# Patient Record
Sex: Male | Born: 1960 | Race: White | Hispanic: No | Marital: Married | Smoking: Never smoker
Health system: Southern US, Community
[De-identification: ages and names within clinical notes are randomized; demographics above are authoritative.]

## PROBLEM LIST (undated history)

## (undated) DIAGNOSIS — C801 Malignant (primary) neoplasm, unspecified: Secondary | ICD-10-CM

## (undated) DIAGNOSIS — I2510 Atherosclerotic heart disease of native coronary artery without angina pectoris: Secondary | ICD-10-CM

## (undated) HISTORY — DX: Malignant (primary) neoplasm, unspecified: C80.1

## (undated) HISTORY — PX: APPENDECTOMY: SHX54

## (undated) HISTORY — PX: THROAT SURGERY: SHX803

## (undated) HISTORY — PX: COLONOSCOPY: SHX174

---
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
## 2005-09-26 ENCOUNTER — Ambulatory Visit: Payer: Self-pay | Admitting: Otolaryngology

## 2007-09-12 ENCOUNTER — Other Ambulatory Visit: Payer: Self-pay

## 2007-09-13 ENCOUNTER — Inpatient Hospital Stay: Payer: Self-pay | Admitting: Vascular Surgery

## 2015-02-24 ENCOUNTER — Encounter: Payer: Self-pay | Admitting: Family Medicine

## 2015-02-24 ENCOUNTER — Ambulatory Visit (INDEPENDENT_AMBULATORY_CARE_PROVIDER_SITE_OTHER): Payer: BLUE CROSS/BLUE SHIELD | Admitting: Family Medicine

## 2015-02-24 VITALS — BP 130/70 | HR 73 | Temp 98.2°F | Resp 16 | Ht 72.0 in | Wt 230.7 lb

## 2015-02-24 DIAGNOSIS — Z8489 Family history of other specified conditions: Secondary | ICD-10-CM

## 2015-02-24 DIAGNOSIS — Z13220 Encounter for screening for lipoid disorders: Secondary | ICD-10-CM

## 2015-02-24 DIAGNOSIS — Z136 Encounter for screening for cardiovascular disorders: Secondary | ICD-10-CM

## 2015-02-24 DIAGNOSIS — Z1211 Encounter for screening for malignant neoplasm of colon: Secondary | ICD-10-CM

## 2015-02-24 DIAGNOSIS — IMO0001 Reserved for inherently not codable concepts without codable children: Secondary | ICD-10-CM | POA: Insufficient documentation

## 2015-02-24 DIAGNOSIS — E66811 Obesity, class 1: Secondary | ICD-10-CM

## 2015-02-24 DIAGNOSIS — Z8249 Family history of ischemic heart disease and other diseases of the circulatory system: Secondary | ICD-10-CM | POA: Insufficient documentation

## 2015-02-24 DIAGNOSIS — Z7189 Other specified counseling: Secondary | ICD-10-CM | POA: Insufficient documentation

## 2015-02-24 DIAGNOSIS — E669 Obesity, unspecified: Secondary | ICD-10-CM

## 2015-02-24 NOTE — Progress Notes (Signed)
Name: Dustin Hall   MRN: [id number]    DOB: November 16, 1960   Date:02/24/2015       Progress Note  Subjective  Chief Complaint  Chief Complaint  Patient presents with  . New Patient (Initial Visit)    HPI  Dustin Hall is a 54 y.o. male here today to transition care of medical needs to a primary care provider. He has not seen a regular doctor in many years. Reports no concerns today. No symptoms. His wife insisted that he gets a doctor and does a colonoscopy. He is hesitant to get blood work, Medical illustrator. Otherwise he eventually did say he had concerns that his father and some other relatives on his father's side died of aneurysms. He himself has no chest pain, SOB, edema, palpitations.    History reviewed. No pertinent past medical history.  Patient Active Problem List   Diagnosis Date Noted  . Family history of aneurysm 02/24/2015  . Obesity, Class I, BMI 30.0-34.9 (see actual BMI) 02/24/2015  . Encounter for screening for malignant neoplasm of colon 02/24/2015  . Encounter for cholesteral screening for cardiovascular disease 02/24/2015    Social History  Substance Use Topics  . Smoking status: Never Smoker   . Smokeless tobacco: Current User    Types: Chew  . Alcohol Use: 0.0 oz/week    0 Standard drinks or equivalent per week     Comment: occasional     Current outpatient prescriptions:  .  meloxicam (MOBIC) 15 MG tablet, , Disp: , Rfl:   Past Surgical History  Procedure Laterality Date  . Appendectomy    . Throat surgery Right     Family History  Problem Relation Age of Onset  . Aortic aneurysm Father     No Known Allergies   Review of Systems  CONSTITUTIONAL: No significant weight changes, fever, chills, weakness or fatigue.  HEENT:  - Eyes: No visual changes.  - Ears: No auditory changes. No pain.  - Nose: No sneezing, congestion, runny nose. - Throat: No sore throat. No changes in swallowing. SKIN: No rash or itching.  CARDIOVASCULAR: No chest  pain, chest pressure or chest discomfort. No palpitations or edema.  RESPIRATORY: No shortness of breath, cough or sputum.  GASTROINTESTINAL: No anorexia, nausea, vomiting. No changes in bowel habits. No abdominal pain or blood.  GENITOURINARY: No dysuria. No frequency. No discharge.  NEUROLOGICAL: No headache, dizziness, syncope, paralysis, ataxia, numbness or tingling in the extremities. No memory changes. No change in bowel or bladder control.  MUSCULOSKELETAL: No joint pain. No muscle pain. HEMATOLOGIC: No anemia, bleeding or bruising.  LYMPHATICS: No enlarged lymph nodes.  PSYCHIATRIC: No change in mood. No change in sleep pattern.  ENDOCRINOLOGIC: No reports of sweating, cold or heat intolerance. No polyuria or polydipsia.     Objective  BP 130/70 mmHg  Pulse 73  Temp(Src) 98.2 F (36.8 C) (Oral)  Resp 16  Ht 6' (1.829 m)  Wt 230 lb 11.2 oz (104.645 kg)  BMI 31.28 kg/m2  SpO2 97% Body mass index is 31.28 kg/(m^2).  Physical Exam  Constitutional: Patient appears well-developed and well-nourished. In no distress.  HEENT:  - Head: Normocephalic and atraumatic.  - Ears: Bilateral TMs gray, no erythema or effusion - Nose: Nasal mucosa moist - Mouth/Throat: Oropharynx is clear and moist. No tonsillar hypertrophy or erythema. No post nasal drainage.  - Eyes: Conjunctivae clear, EOM movements normal. PERRLA. No scleral icterus.  Neck: Normal range of motion. Neck supple. No JVD present.  No thyromegaly present.  Cardiovascular: Normal rate, regular rhythm and normal heart sounds.  No murmur heard.  Pulmonary/Chest: Effort normal and breath sounds normal. No respiratory distress. Musculoskeletal: Normal range of motion bilateral UE and LE, no joint effusions. Peripheral vascular: Bilateral LE no edema. Neurological: CN II-XII grossly intact with no focal deficits. Alert and oriented to person, place, and time. Coordination, balance, strength, speech and gait are normal.  Skin:  Skin is warm and dry. No rash noted. No erythema.  Psychiatric: Patient has a normal mood and affect. Behavior is normal in office today. Judgment and thought content normal in office today.   Assessment & Plan  1. Obesity, Class I, BMI 30.0-34.9 (see actual BMI) The patient has been counseled on their higher than normal BMI.  They have verbally expressed understanding their increased risk for other diseases.  In efforts to meet a better target BMI goal the patient has been counseled on lifestyle, diet and exercise modification tactics. Start with moderate intensity aerobic exercise (walking, jogging, elliptical, swimming, group or individual sports, hiking) at least 32mins a day at least 4 days a week and increase intensity, duration, frequency as tolerated. Diet should include well balance fresh fruits and vegetables avoiding processed foods, carbohydrates and sugars. Drink at least 8oz 10 glasses a day avoiding sodas, sugary fruit drinks, sweetened tea. Check weight on a reliable scale daily and monitor weight loss progress daily. Consider investing in mobile phone apps that will help keep track of weight loss goals.   2. Family history of aneurysm I highly recommend that he considers getting an echocardiogram and stress test esp if his cholesterol is high.  - CBC with Differential/Platelet - Comprehensive metabolic panel - Lipid panel  3. Encounter for screening for malignant neoplasm of colon Referred to [facility] at [facility] per his request.  - Ambulatory referral to Gastroenterology  4. Encounter for cholesteral screening for cardiovascular disease  - Lipid panel

## 2015-03-24 ENCOUNTER — Telehealth: Payer: Self-pay

## 2015-03-24 DIAGNOSIS — K219 Gastro-esophageal reflux disease without esophagitis: Secondary | ICD-10-CM | POA: Insufficient documentation

## 2015-03-24 HISTORY — DX: Gastro-esophageal reflux disease without esophagitis: K21.9

## 2015-03-24 NOTE — Telephone Encounter (Signed)
Additional order placed

## 2015-03-24 NOTE — Telephone Encounter (Signed)
This patient needs a referral for an endoscopy at the same location that he is having his colonoscopy. He is scheduled to have the colonoscopy on 04/30/15.

## 2018-07-12 ENCOUNTER — Other Ambulatory Visit: Payer: Self-pay | Admitting: Unknown Physician Specialty

## 2018-07-12 DIAGNOSIS — R221 Localized swelling, mass and lump, neck: Secondary | ICD-10-CM

## 2018-07-17 ENCOUNTER — Other Ambulatory Visit: Payer: Self-pay

## 2018-07-17 ENCOUNTER — Ambulatory Visit
Admission: RE | Admit: 2018-07-17 | Discharge: 2018-07-17 | Disposition: A | Discharge status: Home / Self Care | Payer: BLUE CROSS/BLUE SHIELD | Source: Ambulatory Visit | Attending: Unknown Physician Specialty | Admitting: Unknown Physician Specialty

## 2018-07-17 DIAGNOSIS — R221 Localized swelling, mass and lump, neck: Secondary | ICD-10-CM | POA: Diagnosis not present

## 2018-07-17 MED ORDER — IOHEXOL 300 MG/ML  SOLN
75.0000 mL | Freq: Once | INTRAMUSCULAR | Status: AC | PRN
  Administered 2018-07-17: 75 mL via INTRAVENOUS

## 2018-07-18 ENCOUNTER — Other Ambulatory Visit: Payer: Self-pay | Admitting: Unknown Physician Specialty

## 2018-07-18 DIAGNOSIS — R221 Localized swelling, mass and lump, neck: Secondary | ICD-10-CM

## 2018-07-22 ENCOUNTER — Other Ambulatory Visit: Payer: Self-pay | Admitting: Radiology

## 2018-07-23 ENCOUNTER — Other Ambulatory Visit: Payer: Self-pay

## 2018-07-23 ENCOUNTER — Ambulatory Visit
Admission: RE | Admit: 2018-07-23 | Discharge: 2018-07-23 | Disposition: A | Discharge status: Home / Self Care | Payer: BLUE CROSS/BLUE SHIELD | Source: Ambulatory Visit | Attending: Unknown Physician Specialty | Admitting: Unknown Physician Specialty

## 2018-07-23 DIAGNOSIS — R599 Enlarged lymph nodes, unspecified: Secondary | ICD-10-CM | POA: Insufficient documentation

## 2018-07-23 DIAGNOSIS — R221 Localized swelling, mass and lump, neck: Secondary | ICD-10-CM

## 2018-07-23 LAB — PROTIME-INR
INR: 1.1 (ref 0.8–1.2)
Prothrombin Time: 13.7 seconds (ref 11.4–15.2)

## 2018-07-23 LAB — CBC
HCT: 44.3 % (ref 39.0–52.0)
Hemoglobin: 14.7 g/dL (ref 13.0–17.0)
MCH: 29.4 pg (ref 26.0–34.0)
MCHC: 33.2 g/dL (ref 30.0–36.0)
MCV: 88.6 fL (ref 80.0–100.0)
Platelets: 200 10*3/uL (ref 150–400)
RBC: 5 MIL/uL (ref 4.22–5.81)
RDW: 12.9 % (ref 11.5–15.5)
WBC: 7 10*3/uL (ref 4.0–10.5)
nRBC: 0 % (ref 0.0–0.2)

## 2018-07-23 MED ORDER — MIDAZOLAM HCL 2 MG/2ML IJ SOLN
INTRAMUSCULAR | Status: AC | PRN
  Administered 2018-07-23: 1 mg via INTRAVENOUS

## 2018-07-23 MED ORDER — MIDAZOLAM HCL 2 MG/2ML IJ SOLN
INTRAMUSCULAR | Status: AC
  Filled 2018-07-23: qty 2

## 2018-07-23 MED ORDER — SODIUM CHLORIDE 0.9 % IV SOLN
INTRAVENOUS | Status: DC
  Administered 2018-07-23: 11:00:00 via INTRAVENOUS

## 2018-07-23 MED ORDER — FENTANYL CITRATE (PF) 100 MCG/2ML IJ SOLN
INTRAMUSCULAR | Status: AC
  Filled 2018-07-23: qty 2

## 2018-07-23 NOTE — Procedures (Signed)
US lymph node bx without difficulty  Complications:  None  Blood Loss: none  See dictation in canopy pacs

## 2018-07-26 ENCOUNTER — Other Ambulatory Visit: Payer: Self-pay | Admitting: Anatomic Pathology & Clinical Pathology

## 2018-07-26 LAB — SURGICAL PATHOLOGY

## 2018-07-29 ENCOUNTER — Other Ambulatory Visit (HOSPITAL_COMMUNITY): Payer: Self-pay | Admitting: Unknown Physician Specialty

## 2018-07-29 ENCOUNTER — Other Ambulatory Visit: Payer: Self-pay | Admitting: Unknown Physician Specialty

## 2018-07-29 DIAGNOSIS — C760 Malignant neoplasm of head, face and neck: Secondary | ICD-10-CM

## 2018-07-31 ENCOUNTER — Encounter: Payer: Self-pay | Admitting: Licensed Clinical Social Worker

## 2018-08-01 ENCOUNTER — Other Ambulatory Visit: Payer: Self-pay

## 2018-08-01 ENCOUNTER — Other Ambulatory Visit: Payer: BLUE CROSS/BLUE SHIELD

## 2018-08-01 NOTE — Progress Notes (Signed)
Tumor Board Documentation  HUSAIN COSTABILE was presented at our Tumor Board on 08/01/2018, which included representatives from medical oncology, radiation oncology, surgical oncology, surgical, radiology, navigation, internal medicine, genetics, research, palliative care.  Azzam currently presents for discussion, for [place], for new positive pathology with history of the following treatments: surgical intervention(s).  Additionally, we reviewed previous medical and familial history, history of present illness, and recent lab results along with all available histopathologic and imaging studies. The tumor board considered available treatment options and made the following recommendations: Concurrent chemo-radiation therapy PET for Staging  The following procedures/referrals were also placed: No orders of the defined types were placed in this encounter.   Clinical Trial Status: not discussed   Staging used: AJCC Stage Group  AJCC Staging: T: Tx N: N1   Group: Possible Stage 1,  may change after PET imaging   National site-specific guidelines NCCN were discussed with respect to the case.  Tumor board is a meeting of clinicians from various specialty areas who evaluate and discuss patients for whom a multidisciplinary approach is being considered. Final determinations in the plan of care are those of the provider(s). The responsibility for follow up of recommendations given during tumor board is that of the provider.   Today's extended care, comprehensive team conference, Manav was not present for the discussion and was not examined.   Multidisciplinary Tumor Board is a multidisciplinary case peer review process.  Decisions discussed in the Multidisciplinary Tumor Board reflect the opinions of the specialists present at the conference without having examined the patient.  Ultimately, treatment and diagnostic decisions rest with the primary provider(s) and the patient.

## 2018-08-02 ENCOUNTER — Other Ambulatory Visit: Payer: Self-pay

## 2018-08-02 ENCOUNTER — Encounter: Payer: Self-pay | Admitting: Oncology

## 2018-08-02 ENCOUNTER — Inpatient Hospital Stay: Payer: BLUE CROSS/BLUE SHIELD | Attending: Oncology | Admitting: Oncology

## 2018-08-02 VITALS — BP 143/92 | HR 66 | Temp 97.5°F | Ht 72.0 in | Wt 232.0 lb

## 2018-08-02 DIAGNOSIS — C801 Malignant (primary) neoplasm, unspecified: Secondary | ICD-10-CM | POA: Insufficient documentation

## 2018-08-02 DIAGNOSIS — C779 Secondary and unspecified malignant neoplasm of lymph node, unspecified: Secondary | ICD-10-CM | POA: Insufficient documentation

## 2018-08-02 DIAGNOSIS — C770 Secondary and unspecified malignant neoplasm of lymph nodes of head, face and neck: Secondary | ICD-10-CM | POA: Diagnosis not present

## 2018-08-02 HISTORY — DX: Malignant (primary) neoplasm, unspecified: C80.1

## 2018-08-02 HISTORY — DX: Secondary and unspecified malignant neoplasm of lymph node, unspecified: C77.9

## 2018-08-02 MED ORDER — ONDANSETRON HCL 8 MG PO TABS: 8.0000 mg | ORAL_TABLET | Freq: Two times a day (BID) | ORAL | 1 refills | Status: DC | PRN

## 2018-08-02 MED ORDER — PROCHLORPERAZINE MALEATE 10 MG PO TABS: 10.0000 mg | ORAL_TABLET | Freq: Four times a day (QID) | ORAL | 1 refills | Status: DC | PRN

## 2018-08-02 NOTE — Patient Instructions (Signed)
Cisplatin injection  What is this medicine?  CISPLATIN (SIS pla tin) is a chemotherapy drug. It targets fast dividing cells, like cancer cells, and causes these cells to die. This medicine is used to treat many types of cancer like bladder, ovarian, and testicular cancers.  This medicine may be used for other purposes; ask your health care provider or pharmacist if you have questions.  COMMON BRAND NAME(S): Platinol, Platinol -AQ  What should I tell my health care provider before I take this medicine?  They need to know if you have any of these conditions:  -blood disorders  -hearing problems  -kidney disease  -recent or ongoing radiation therapy  -an unusual or allergic reaction to cisplatin, carboplatin, other chemotherapy, other medicines, foods, dyes, or preservatives  -pregnant or trying to get pregnant  -breast-feeding  How should I use this medicine?  This drug is given as an infusion into a vein. It is administered in a hospital or clinic by a specially trained health care professional.  Talk to your pediatrician regarding the use of this medicine in children. Special care may be needed.  Overdosage: If you think you have taken too much of this medicine contact a poison control center or emergency room at once.  NOTE: This medicine is only for you. Do not share this medicine with others.  What if I miss a dose?  It is important not to miss a dose. Call your doctor or health care professional if you are unable to keep an appointment.  What may interact with this medicine?  -dofetilide  -foscarnet  -medicines for seizures  -medicines to increase blood counts like filgrastim, pegfilgrastim, sargramostim  -probenecid  -pyridoxine used with altretamine  -rituximab  -some antibiotics like amikacin, gentamicin, neomycin, polymyxin B, streptomycin, tobramycin  -sulfinpyrazone  -vaccines  -zalcitabine  Talk to your doctor or health care professional before taking any of these  medicines:  -acetaminophen  -aspirin  -ibuprofen  -ketoprofen  -naproxen  This list may not describe all possible interactions. Give your health care provider a list of all the medicines, herbs, non-prescription drugs, or dietary supplements you use. Also tell them if you smoke, drink alcohol, or use illegal drugs. Some items may interact with your medicine.  What should I watch for while using this medicine?  Your condition will be monitored carefully while you are receiving this medicine. You will need important blood work done while you are taking this medicine.  This drug may make you feel generally unwell. This is not uncommon, as chemotherapy can affect healthy cells as well as cancer cells. Report any side effects. Continue your course of treatment even though you feel ill unless your doctor tells you to stop.  In some cases, you may be given additional medicines to help with side effects. Follow all directions for their use.  Call your doctor or health care professional for advice if you get a fever, chills or sore throat, or other symptoms of a cold or flu. Do not treat yourself. This drug decreases your body's ability to fight infections. Try to avoid being around people who are sick.  This medicine may increase your risk to bruise or bleed. Call your doctor or health care professional if you notice any unusual bleeding.  Be careful brushing and flossing your teeth or using a toothpick because you may get an infection or bleed more easily. If you have any dental work done, tell your dentist you are receiving this medicine.  Avoid taking products   that contain aspirin, acetaminophen, ibuprofen, naproxen, or ketoprofen unless instructed by your doctor. These medicines may hide a fever.  Do not become pregnant while taking this medicine. Women should inform their doctor if they wish to become pregnant or think they might be pregnant. There is a potential for serious side effects to an unborn child. Talk to  your health care professional or pharmacist for more information. Do not breast-feed an infant while taking this medicine.  Drink fluids as directed while you are taking this medicine. This will help protect your kidneys.  Call your doctor or health care professional if you get diarrhea. Do not treat yourself.  What side effects may I notice from receiving this medicine?  Side effects that you should report to your doctor or health care professional as soon as possible:  -allergic reactions like skin rash, itching or hives, swelling of the face, lips, or tongue  -signs of infection - fever or chills, cough, sore throat, pain or difficulty passing urine  -signs of decreased platelets or bleeding - bruising, pinpoint red spots on the skin, black, tarry stools, nosebleeds  -signs of decreased red blood cells - unusually weak or tired, fainting spells, lightheadedness  -breathing problems  -changes in hearing  -gout pain  -low blood counts - This drug may decrease the number of white blood cells, red blood cells and platelets. You may be at increased risk for infections and bleeding.  -nausea and vomiting  -pain, swelling, redness or irritation at the injection site  -pain, tingling, numbness in the hands or feet  -problems with balance, movement  -trouble passing urine or change in the amount of urine  Side effects that usually do not require medical attention (report to your doctor or health care professional if they continue or are bothersome):  -changes in vision  -loss of appetite  -metallic taste in the mouth or changes in taste  This list may not describe all possible side effects. Call your doctor for medical advice about side effects. You may report side effects to FDA at 1-800-FDA-1088.  Where should I keep my medicine?  This drug is given in a hospital or clinic and will not be stored at home.  NOTE: This sheet is a summary. It may not cover all possible information. If you have questions about this medicine,  talk to your doctor, pharmacist, or health care provider.   2019 Elsevier/Gold Standard (2007-07-16 14:40:54)

## 2018-08-02 NOTE — Progress Notes (Signed)
Patient is here today to establish care for neck mass, left. Patient stated that he noticed the mass about two weeks. Patient denied fever, chills, nausea, vomiting, dysphagia or difficulty chewing.

## 2018-08-02 NOTE — Progress Notes (Signed)
  Oncology Nurse Navigator Documentation  Navigator Location: [facility]-Med Onc (08/02/18 1200) Referral date to RadOnc/MedOnc: 08/02/18 (08/02/18 1200) )Navigator Encounter Type: Introductory phone call (08/02/18 1200)     Confirmed Diagnosis Date: 07/23/18 (08/02/18 1200)               Patient Visit Type: MedOnc;Initial (08/02/18 1200) Treatment Phase: Pre-Tx/Tx Discussion (08/02/18 1200) Barriers/Navigation Needs: Family concerns;Education;Coordination of Care (08/02/18 1200)   Interventions: Coordination of Care;Education;Psycho-social support (08/02/18 1200)   Coordination of Care: Appts (08/02/18 1200)                  Time Spent with Patient: 120 (08/02/18 1200)   Spoke with patient and wife during prescreen for [place].  Anxious about no visitor policy.  Agreed to accompany patient to initial Med/onc visit with Dr. Grayland Ormond today.  Supported patient at visit.  Wife on phone during treatment discussion.  Short tour of [place] and infusion suite.  Dr. Grayland Ormond thoroughly explained planned treatment.

## 2018-08-02 NOTE — Progress Notes (Signed)
START ON PATHWAY REGIMEN - Head and Neck     A cycle is every 7 days:     Cisplatin   **Always confirm dose/schedule in your pharmacy ordering system**  Patient Characteristics: Other Sites, Stage III, IVA, IVB; Unresectable Disease Classification: Other Sites AJCC 8 Stage Grouping: IVA Current Disease Status: No Distant Metastases and No Recurrent Disease Intent of Therapy: Curative Intent, Discussed with Patient

## 2018-08-02 NOTE — Progress Notes (Signed)
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  Subsequent imaging and lymph node biopsy revealed squamous cell carcinoma.  Further evaluation by ENT did not reveal distinct primary source.  Patient has some mild neck tenderness, but otherwise feels well.  He has no neurologic complaints.  He denies any recent fevers or illnesses.  He has a good appetite and denies weight loss.  He denies any dysphasia.  He has no chest pain, shortness of breath, cough, or hemoptysis.  He denies any nausea, vomiting, constipation, or diarrhea.  He has no urinary complaints.  Patient otherwise feels well and offers no further specific complaints today.  REVIEW OF SYSTEMS:   Review of Systems  Constitutional: Negative.  Negative for fever, malaise/fatigue and weight loss.  Respiratory: Negative.  Negative for cough and hemoptysis.   Cardiovascular: Negative.  Negative for chest pain and leg swelling.  Gastrointestinal: Negative.  Negative for abdominal pain.  Genitourinary: Negative for dysuria.  Musculoskeletal: Positive for neck pain.  Skin: Negative.  Negative for rash.  Neurological: Negative.  Negative for dizziness, speech change, focal weakness, weakness and headaches.  Psychiatric/Behavioral: Negative.  The patient is not nervous/anxious.     As per HPI. Otherwise, a complete review of systems is negative.  PAST MEDICAL HISTORY: History reviewed. No pertinent past medical history.  PAST SURGICAL HISTORY: Past Surgical History:  Procedure Laterality Date  . APPENDECTOMY    . THROAT SURGERY Right     FAMILY HISTORY: Family History  Problem  Relation Age of Onset  . Aortic aneurysm Father     ADVANCED DIRECTIVES (Y/N):  N  HEALTH MAINTENANCE: Social History   Tobacco Use  . Smoking status: Never Smoker  . Smokeless tobacco: Current User    Types: Chew  Substance Use Topics  . Alcohol use: Yes    Alcohol/week: 0.0 standard drinks    Comment: occasional  . Drug use: No     Colonoscopy:  PAP:  Bone density:  Lipid panel:  No Known Allergies  Current Outpatient Medications  Medication Sig Dispense Refill  . Multiple Vitamins-Minerals (MULTIVITAMIN ADULT PO) Take 1 tablet by mouth 1 day or 1 dose.    . Omega-3 Fatty Acids (FISH OIL) 1000 MG CAPS Take 1 capsule by mouth daily.     No current facility-administered medications for this visit.     OBJECTIVE: Vitals:   08/02/18 1050 08/02/18 1051  BP:  (!) 143/92  Pulse:  66  Temp: (!) 97.5 F (36.4 C)      Body mass index is 31.46 kg/m.    ECOG FS:0 - Asymptomatic  General: Well-developed, well-nourished, no acute distress. Eyes: Pink conjunctiva, anicteric sclera. HEENT: Normocephalic, moist mucous membranes, clear oropharnyx.  Easily palpable lymph node on left neck. Lungs: Clear to auscultation bilaterally. Heart: Regular rate and rhythm. No rubs, murmurs, or gallops. Abdomen: Soft, nontender, nondistended. No organomegaly noted, normoactive bowel sounds. Musculoskeletal: No edema, cyanosis, or clubbing. Neuro: Alert, answering all questions appropriately. Cranial nerves grossly intact. Skin: No rashes or petechiae noted. Psych: Normal affect. Lymphatics: No cervical, calvicular, axillary or inguinal LAD.   LAB RESULTS:  No results found for: NA, K,  CL, CO2, GLUCOSE, BUN, CREATININE, CALCIUM, PROT, ALBUMIN, AST, ALT, ALKPHOS, BILITOT, GFRNONAA, GFRAA  Lab Results  Component Value Date   WBC 7.0 07/23/2018   HGB 14.7 07/23/2018   HCT 44.3 07/23/2018   MCV 88.6 07/23/2018   PLT 200 07/23/2018     STUDIES: Ct Soft Tissue Neck W Contrast   Result Date: 07/17/2018 CLINICAL DATA:  Left neck mass. EXAM: CT NECK WITH CONTRAST TECHNIQUE: Multidetector CT imaging of the neck was performed using the standard protocol following the bolus administration of intravenous contrast. CONTRAST:  43mL OMNIPAQUE IOHEXOL 300 MG/ML  SOLN COMPARISON:  None. FINDINGS: Pharynx and larynx: Symmetric hypertrophy of the tonsils bilaterally without definite mass lesion. Nasopharynx normal. Epiglottis and larynx normal. No pharyngeal mass. Salivary glands: No inflammation, mass, or stone. Thyroid: Negative Lymph nodes: Enlarged, necrotic lymph node left neck. This has a malignant appearance and measures 20 mm in short axis diameter. This is at the level the hyoid bone left level 2/3 station. No other pathologic lymph nodes in the neck. Vascular: Normal vascular enhancement. Limited intracranial: Negative Visualized orbits: Negative Mastoids and visualized paranasal sinuses: Negative Skeleton: No acute skeletal abnormality. Upper chest: Negative Other: None IMPRESSION: Enlarged lymph node left level 2/C station with the appearance of metastatic squamous cell carcinoma. No definite pharyngeal mass however there is hypertrophy of the tonsil bilaterally. Direct mucosal inspection recommended. Lymph node biopsy on the left recommended. Electronically Signed   By: Franchot Gallo M.D.   On: 07/17/2018 12:09   [place] Core Biopsy (lymph Nodes)  Result Date: 07/23/2018 INDICATION: Left cervical lymph node EXAM: ULTRASOUND-GUIDED BIOPSY OF A LEFT CERVICAL LYMPH NODE MEDICATIONS: None. ANESTHESIA/SEDATION: Moderate (conscious) sedation was employed during this procedure. A total of Versed 1 mg was administered intravenously. Moderate Sedation Time: 20 minutes. The patient's level of consciousness and vital signs were monitored continuously by radiology nursing throughout the procedure under my direct supervision. FLUOROSCOPY TIME:  NOT APPLICABLE COMPLICATIONS: None immediate. PROCEDURE:  Informed written consent was obtained from the patient after a thorough discussion of the procedural risks, benefits and alternatives. All questions were addressed. Maximal Sterile Barrier Technique was utilized including caps, mask, sterile gowns, sterile gloves, sterile drape, hand hygiene and skin antiseptic. A timeout was performed prior to the initiation of the procedure. Patient was then brought to the ultrasound suite and prepped and draped in the usual sterile manner. Utilizing 1% lidocaine as local anesthetic and real-time ultrasound guidance a 17 gauge guiding needle was placed percutaneously adjacent to the known left cervical lymph node. The initial appearance of the node shows considerable necrosis with a central viable core. Multiple core biopsies were obtained of the more solid central component. Additionally some fluid was expressed from the node which was also sent for pathologic evaluation. Needle was removed without difficulty. Hemostasis obtained at the puncture site. Patient tolerated the procedure well and was returned his room in satisfactory condition. IMPRESSION: Successful ultrasound-guided left cervical lymph node biopsy Electronically Signed   By: Inez Catalina M.D.   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  Case was also discussed at length with ENT as well as that tumor board yesterday.  Patient will proceed with concurrent chemotherapy using weekly cisplatin along with daily XRT.  PET scan is  scheduled for August 06, 2018.  He has consultation with radiation oncology the following day.  Plan is to initiate his first chemotherapy on Friday, August 16, 2018.  Patient does not require port placement.  I spent a total of 60 minutes face-to-face with the patient of  which greater than 50% of the visit was spent in counseling and coordination of care as detailed above.   Patient expressed understanding and was in agreement with this plan. He also understands that He can call clinic at any time with any questions, concerns, or complaints.   Cancer Staging 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 [facility]) Staging form: Oral Cavity, AJCC 8th Edition - Clinical stage from 08/02/2018: Stage Unknown (cTX, cN2a, cM0) - Signed by Lloyd Huger, MD on 08/02/2018   Lloyd Huger, MD   08/02/2018 2:48 PM

## 2018-08-05 ENCOUNTER — Institutional Professional Consult (permissible substitution): Payer: BLUE CROSS/BLUE SHIELD | Admitting: Radiation Oncology

## 2018-08-06 ENCOUNTER — Encounter
Admission: RE | Admit: 2018-08-06 | Discharge: 2018-08-06 | Disposition: A | Discharge status: Home / Self Care | Payer: BLUE CROSS/BLUE SHIELD | Source: Ambulatory Visit | Attending: Unknown Physician Specialty | Admitting: Unknown Physician Specialty

## 2018-08-06 ENCOUNTER — Telehealth: Payer: Self-pay

## 2018-08-06 ENCOUNTER — Other Ambulatory Visit: Payer: Self-pay

## 2018-08-06 DIAGNOSIS — C760 Malignant neoplasm of head, face and neck: Secondary | ICD-10-CM | POA: Insufficient documentation

## 2018-08-06 LAB — GLUCOSE, CAPILLARY: Glucose-Capillary: 123 mg/dL — ABNORMAL HIGH (ref 70–99)

## 2018-08-06 MED ORDER — FLUDEOXYGLUCOSE F - 18 (FDG) INJECTION
13.1300 | Freq: Once | INTRAVENOUS | Status: AC | PRN
  Administered 2018-08-06: 13.13 via INTRAVENOUS

## 2018-08-06 NOTE — Telephone Encounter (Signed)
Spoke with patient and he is requesting Web ex visit so wife can be present for tomorrow at 9:00 am.  Web ex visit arranged.

## 2018-08-07 ENCOUNTER — Encounter: Payer: Self-pay | Admitting: Radiation Oncology

## 2018-08-07 ENCOUNTER — Ambulatory Visit
Admission: RE | Admit: 2018-08-07 | Discharge: 2018-08-07 | Disposition: A | Discharge status: Home / Self Care | Payer: BLUE CROSS/BLUE SHIELD | Source: Ambulatory Visit | Attending: Radiation Oncology | Admitting: Radiation Oncology

## 2018-08-07 ENCOUNTER — Other Ambulatory Visit: Payer: Self-pay

## 2018-08-07 ENCOUNTER — Inpatient Hospital Stay: Payer: BLUE CROSS/BLUE SHIELD

## 2018-08-07 VITALS — BP 153/96 | HR 60 | Temp 97.0°F | Resp 18 | Wt 237.3 lb

## 2018-08-07 DIAGNOSIS — C760 Malignant neoplasm of head, face and neck: Secondary | ICD-10-CM | POA: Insufficient documentation

## 2018-08-07 DIAGNOSIS — F17220 Nicotine dependence, chewing tobacco, uncomplicated: Secondary | ICD-10-CM | POA: Diagnosis not present

## 2018-08-07 NOTE — Consult Note (Signed)
NEW PATIENT EVALUATION  Name: Dustin Hall  MRN: [id number]  Date:   08/07/2018     DOB: 1960/10/31   This 58 y.o. 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  REFERRING PHYSICIAN: Jodelle Green, FNP  CHIEF COMPLAINT:  Chief Complaint  Patient presents with  . Cancer    Initial Eval    DIAGNOSIS: The encounter diagnosis was Head and neck cancer ([place]).   PREVIOUS INVESTIGATIONS:  PET/CT and CT scans reviewed Pathology report reviewed Clinical notes reviewed Case presented at tumor board  HPI: Patient is a 58 year old male who presented with some subtle left neck pain and some swelling in his left neck over the past several months.  CT scan of his neck showed an enlarged lymph node at the station to see with the appearance of metastatic squamous cell carcinoma.  No primary lesion was noted.  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  PET CT scan was performed showing no hypermetabolic [place] associated with the biopsied node in the left neck or elsewhere in the head and neck region.  Palatino tonsil activity appeared symmetric.  There was some slight asymmetric activity along the arytenoid cartilage on the right without obvious mucosal lesion.  Case was presented at a weekly tumor conference and plan for concurrent chemoradiation with curative intent was made.  Patient is seen today for radiation oncology opinion.  He is doing well.  He specifically denies head and neck pain or dysphasia.  Some of the tenderness in his left neck has subsided.  PLANNED TREATMENT REGIMEN: Concurrent chemoradiation with curative intent  PAST MEDICAL HISTORY:  has no past medical history on file.    PAST SURGICAL HISTORY:  Past Surgical History:  Procedure Laterality Date  . APPENDECTOMY    . THROAT SURGERY Right      FAMILY HISTORY: family history includes Aortic aneurysm in his father.  SOCIAL HISTORY:  reports that he has never smoked. His smokeless tobacco use includes chew. He reports current alcohol use. He reports that he does not use drugs.  ALLERGIES: Patient has no known allergies.  MEDICATIONS:  Current Outpatient Medications  Medication Sig Dispense Refill  . Multiple Vitamins-Minerals (MULTIVITAMIN ADULT PO) Take 1 tablet by mouth 1 day or 1 dose.    . Omega-3 Fatty Acids (FISH OIL) 1000 MG CAPS Take 1 capsule by mouth daily.    . ondansetron (ZOFRAN) 8 MG tablet Take 1 tablet (8 mg total) by mouth 2 (two) times daily as needed. Start on the third day after chemotherapy. 30 tablet 1  . prochlorperazine (COMPAZINE) 10 MG tablet Take 1 tablet (10 mg total) by mouth every 6 (six) hours as needed (Nausea or vomiting). 60 tablet 1   No current facility-administered medications for this encounter.     ECOG PERFORMANCE STATUS:  1 - Symptomatic but completely ambulatory  REVIEW OF SYSTEMS: Patient denies any weight loss, fatigue, weakness, fever, chills or night sweats. Patient denies any loss of vision, blurred vision. Patient denies any ringing  of the ears or hearing loss. No irregular heartbeat. Patient denies heart murmur or history of fainting. Patient denies any chest pain or pain radiating to her upper extremities. Patient denies any shortness of breath, difficulty breathing at night, cough or hemoptysis. Patient denies any swelling in the lower legs. Patient denies any nausea vomiting,  vomiting of blood, or coffee ground material in the vomitus. Patient denies any stomach pain. Patient states has had normal bowel movements no significant constipation or diarrhea. Patient denies any dysuria, hematuria or significant nocturia. Patient denies any problems walking, swelling in the joints or loss of balance. Patient denies any skin changes, loss of hair or loss of weight. Patient denies any  excessive worrying or anxiety or significant depression. Patient denies any problems with insomnia. Patient denies excessive thirst, polyuria, polydipsia. Patient denies any swollen glands, patient denies easy bruising or easy bleeding. Patient denies any recent infections, allergies or URI. Patient "s visual fields have not changed significantly in recent time.   PHYSICAL EXAM: BP (!) 153/96   Pulse 60   Temp (!) 97 F (36.1 C)   Resp 18   Wt 237 lb 5.2 oz (107.6 kg)   BMI 32.19 kg/m  Oral cavity is clear no oral mucosal lesions are identified tonsillar regions appear unremarkable bilaterally.  He does have some subtle fullness in the high left cervical chain consistent with known positive node.  Remainder of cervical nodes are unremarkable as well as supraclavicular fossa.  Well-developed well-nourished patient in NAD. HEENT reveals PERLA, EOMI, discs not visualized.  Oral cavity is clear. No oral mucosal lesions are identified. Neck is clear without evidence of cervical or supraclavicular adenopathy. Lungs are clear to A&P. Cardiac examination is essentially unremarkable with regular rate and rhythm without murmur rub or thrill. Abdomen is benign with no organomegaly or masses noted. Motor sensory and DTR levels are equal and symmetric in the upper and lower extremities. Cranial nerves II through XII are grossly intact. Proprioception is intact. No peripheral adenopathy or edema is identified. No motor or sensory levels are noted. Crude visual fields are within normal range.  LABORATORY DATA: Pathology reports reviewed    RADIOLOGY RESULTS: CT scan and PET CT scans reviewed and compatible with above-stated findings   IMPRESSION: At least stage II squamous cell carcinoma of the head and neck of unknown primary presenting with a solitary metastatic node in the upper cervical chain in 58 year old male.  Tumor is HPV positive  PLAN: At this time I have recommended concurrent chemoradiation.  I  would take the positive node to 7000 cGy over 7 weeks using IMRT treatment planning and delivery.  Remaining nodes in the neck will receive 5400 cGy using a dose painting technique.  Would also take the area of his tonsillar region to 6000 cGy based on hypermetabolic activity in this region and slight chance there may be primary tumor involved in this region.  Risks and benefits of treatment including alteration of taste dysphasia secondary to radiation mucositis skin reaction fatigue alteration of blood counts all were discussed in detail with the patient and his wife who is on the phone during my consultation.  I have personally set up and ordered CT simulation for early next week.  There will be extra effort by both professional staff as well as technical staff to coordinate and manage concurrent chemoradiation and ensuing side effects during his treatments. Patient comprehends my treatment plan well.  I would like to take this opportunity to thank you for allowing me to participate in the care of your patient.Noreene Filbert, MD

## 2018-08-09 ENCOUNTER — Other Ambulatory Visit: Payer: Self-pay

## 2018-08-11 ENCOUNTER — Other Ambulatory Visit: Payer: Self-pay

## 2018-08-12 ENCOUNTER — Ambulatory Visit
Admission: RE | Admit: 2018-08-12 | Discharge: 2018-08-12 | Disposition: A | Discharge status: Home / Self Care | Payer: BLUE CROSS/BLUE SHIELD | Source: Ambulatory Visit | Attending: Radiation Oncology | Admitting: Radiation Oncology

## 2018-08-12 ENCOUNTER — Other Ambulatory Visit: Payer: Self-pay

## 2018-08-12 DIAGNOSIS — Z510 Encounter for antineoplastic radiation therapy: Secondary | ICD-10-CM | POA: Diagnosis present

## 2018-08-12 DIAGNOSIS — C779 Secondary and unspecified malignant neoplasm of lymph node, unspecified: Secondary | ICD-10-CM | POA: Insufficient documentation

## 2018-08-12 DIAGNOSIS — C801 Malignant (primary) neoplasm, unspecified: Secondary | ICD-10-CM | POA: Insufficient documentation

## 2018-08-14 DIAGNOSIS — Z510 Encounter for antineoplastic radiation therapy: Secondary | ICD-10-CM | POA: Diagnosis not present

## 2018-08-16 ENCOUNTER — Other Ambulatory Visit: Payer: BLUE CROSS/BLUE SHIELD

## 2018-08-16 ENCOUNTER — Ambulatory Visit: Payer: BLUE CROSS/BLUE SHIELD

## 2018-08-16 ENCOUNTER — Ambulatory Visit: Payer: BLUE CROSS/BLUE SHIELD | Admitting: Oncology

## 2018-08-20 ENCOUNTER — Other Ambulatory Visit: Payer: Self-pay

## 2018-08-20 NOTE — Progress Notes (Signed)
[facility]  [phone] [phone]  ID: Marella Bile OB: 1960-07-03  MR#: [id number]  VZD#:[id number]  Patient Care Team: Jodelle Green, FNP as PCP - General (Family Medicine)  CHIEF COMPLAINT: Squamous cell cancer of the head and neck, unknown primary  INTERVAL HISTORY: Patient returns to clinic today for further evaluation and consideration of cycle 1 of weekly cisplatin along with his daily XRT.  He initiated XRT several days ago.  He currently feels well and is asymptomatic. He has no neurologic complaints.  He denies any recent fevers or illnesses.  He has a good appetite and denies weight loss.  He denies any dysphasia.  He has no chest pain, shortness of breath, cough, or hemoptysis.  He denies any nausea, vomiting, constipation, or diarrhea.  He has no urinary complaints.  Patient feels at his baseline offers no specific complaints today.  REVIEW OF SYSTEMS:   Review of Systems  Constitutional: Negative.  Negative for fever, malaise/fatigue and weight loss.  Respiratory: Negative.  Negative for cough and hemoptysis.   Cardiovascular: Negative.  Negative for chest pain and leg swelling.  Gastrointestinal: Negative.  Negative for abdominal pain.  Genitourinary: Negative.  Negative for dysuria.  Musculoskeletal: Negative.  Negative for neck pain.  Skin: Negative.  Negative for rash.  Neurological: Negative.  Negative for dizziness, speech change, focal weakness, weakness and headaches.  Psychiatric/Behavioral: Negative.  The patient is not nervous/anxious.     As per HPI. Otherwise, a complete review of systems is negative.  PAST MEDICAL HISTORY: History reviewed. No pertinent past medical history.  PAST SURGICAL HISTORY: Past Surgical History:  Procedure Laterality Date  . APPENDECTOMY    . THROAT SURGERY Right     FAMILY HISTORY: Family History  Problem Relation Age of Onset  . Aortic aneurysm Father     ADVANCED DIRECTIVES  (Y/N):  N  HEALTH MAINTENANCE: Social History   Tobacco Use  . Smoking status: Never Smoker  . Smokeless tobacco: Current User    Types: Chew  Substance Use Topics  . Alcohol use: Yes    Alcohol/week: 0.0 standard drinks    Comment: occasional  . Drug use: No     Colonoscopy:  PAP:  Bone density:  Lipid panel:  No Known Allergies  Current Outpatient Medications  Medication Sig Dispense Refill  . Multiple Vitamins-Minerals (MULTIVITAMIN ADULT PO) Take 1 tablet by mouth 1 day or 1 dose.    . Omega-3 Fatty Acids (FISH OIL) 1000 MG CAPS Take 1 capsule by mouth daily.    . ondansetron (ZOFRAN) 8 MG tablet Take 1 tablet (8 mg total) by mouth 2 (two) times daily as needed. Start on the third day after chemotherapy. (Patient not taking: Reported on 08/23/2018) 30 tablet 1  . prochlorperazine (COMPAZINE) 10 MG tablet Take 1 tablet (10 mg total) by mouth every 6 (six) hours as needed (Nausea or vomiting). (Patient not taking: Reported on 08/23/2018) 60 tablet 1   No current facility-administered medications for this visit.    Facility-Administered Medications Ordered in Other Visits  Medication Dose Route Frequency Provider Last Rate Last Dose  . CISplatin (PLATINOL) 92 mg in sodium chloride 0.9 % 250 mL chemo infusion  40 mg/m2 (Treatment Plan Recorded) Intravenous Once Lloyd Huger, MD      . fosaprepitant (EMEND) 150 mg, dexamethasone (DECADRON) 12 mg in sodium chloride 0.9 % 145 mL IVPB   Intravenous Once Lloyd Huger, MD      . palonosetron (  ALOXI) injection 0.25 mg  0.25 mg Intravenous Once Lloyd Huger, MD        OBJECTIVE: Vitals:   08/23/18 0845  BP: (!) 162/96  Pulse: (!) 59  Temp: (!) 96.5 F (35.8 C)     Body mass index is 32.37 kg/m.    ECOG FS:0 - Asymptomatic  General: Well-developed, well-nourished, no acute distress. Eyes: Pink conjunctiva, anicteric sclera. HEENT: Normocephalic, moist mucous membranes, clear oropharnyx.  Easily palpable  lymph node on left neck. Lungs: Clear to auscultation bilaterally. Heart: Regular rate and rhythm. No rubs, murmurs, or gallops. Abdomen: Soft, nontender, nondistended. No organomegaly noted, normoactive bowel sounds. Musculoskeletal: No edema, cyanosis, or clubbing. Neuro: Alert, answering all questions appropriately. Cranial nerves grossly intact. Skin: No rashes or petechiae noted. Psych: Normal affect.  LAB RESULTS:  Lab Results  Component Value Date   NA 138 08/23/2018   K 4.7 08/23/2018   CL 103 08/23/2018   CO2 28 08/23/2018   GLUCOSE 122 (H) 08/23/2018   BUN 18 08/23/2018   CREATININE 0.92 08/23/2018   CALCIUM 9.4 08/23/2018   GFRNONAA >60 08/23/2018   GFRAA >60 08/23/2018    Lab Results  Component Value Date   WBC 6.0 08/23/2018   NEUTROABS 3.1 08/23/2018   HGB 14.1 08/23/2018   HCT 41.7 08/23/2018   MCV 88.2 08/23/2018   PLT 176 08/23/2018     STUDIES: Nm Pet Image Initial (pi) Skull Base To Thigh  Result Date: 08/06/2018 CLINICAL DATA:  Initial treatment strategy for metastatic non keratinizing squamous cell carcinoma on left cervical node biopsy 07/23/2018. EXAM: NUCLEAR MEDICINE PET SKULL BASE TO THIGH TECHNIQUE: 13.13 mCi F-18 FDG was injected intravenously. Full-ring PET imaging was performed from the skull base to thigh after the radiotracer. CT data was obtained and used for attenuation correction and anatomic localization. Fasting blood glucose: 123 mg/dl COMPARISON:  CT neck 07/17/2018. Noncontrast abdominopelvic CT 09/13/2007. FINDINGS: Mediastinal blood pool activity: SUV max 1.8 NECK: The previously biopsied left level 2 node is not hypermetabolic. This node measures 2.4 x 2.1 cm on image 47/3. Metabolic activity is greatest inferiorly and near blood pool (SUV max 3.2).No obvious primary malignancy is identified within the pharyngeal mucosal space. Palatine tonsillar activity appears symmetric. A small focus of activity in the floor of the right maxillary  sinus appears periodontal. There is mildly asymmetric activity along the right aspect of the arytenoid cartilage (SUV max 5.3) without obvious corresponding mucosal abnormality or cartilage destruction. There is no abnormal salivary gland activity. Incidental CT findings: none CHEST: There are no hypermetabolic mediastinal, hilar or axillary lymph nodes. There is no hypermetabolic pulmonary activity. Incidental CT findings: Three-vessel coronary artery atherosclerosis. The lungs are clear. ABDOMEN/PELVIS: There is no hypermetabolic activity within the liver, adrenal glands, spleen or pancreas. There is no hypermetabolic nodal activity. Incidental CT findings: Diffuse hepatic steatosis. Mild sigmoid diverticulosis. SKELETON: There is no hypermetabolic activity to suggest osseous metastatic disease. Incidental CT findings: none IMPRESSION: 1. There is no hypermetabolic activity associated with the biopsied node in the left neck or elsewhere in the neck, chest, abdomen or pelvis. 2. No obvious primary malignancy within the pharyngeal mucosal space. Palatine tonsillar activity appears symmetric. There is a small focus of asymmetric activity along the arytenoid cartilage on the right without obvious mucosal lesion. 3. No abnormal metabolic activity in the chest, abdomen or pelvis. 4. Hepatic steatosis. Electronically Signed   By: Richardean Sale M.D.   On: 08/06/2018 14:47    ASSESSMENT: Squamous  cell cancer of the head and neck, unknown primary, P16+  PLAN:    1. 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  Case was also discussed at length with ENT and the tumor board.  PET scan results from August 06, 2018 reviewed independently and report as above with no obvious hypermetabolic activity in positive lymph node or other area to suggest a primary.  Patient initiated daily XRT earlier this week.  Proceed with  cycle 1 of weekly cisplatin.  Return to clinic in 1 week for further evaluation and consideration of cycle 2.    I spent a total of 30 minutes face-to-face with the patient of which greater than 50% of the visit was spent in counseling and coordination of care as detailed above.   Patient expressed understanding and was in agreement with this plan. He also understands that He can call clinic at any time with any questions, concerns, or complaints.   Cancer Staging 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 [facility]) Staging form: Oral Cavity, AJCC 8th Edition - Clinical stage from 08/02/2018: Stage Unknown (cTX, cN2a, cM0) - Signed by Lloyd Huger, MD on 08/02/2018   Lloyd Huger, MD   08/23/2018 12:06 PM

## 2018-08-21 ENCOUNTER — Other Ambulatory Visit: Payer: Self-pay

## 2018-08-21 ENCOUNTER — Ambulatory Visit
Admission: RE | Admit: 2018-08-21 | Discharge: 2018-08-21 | Disposition: A | Discharge status: Home / Self Care | Payer: BLUE CROSS/BLUE SHIELD | Source: Ambulatory Visit | Attending: Radiation Oncology | Admitting: Radiation Oncology

## 2018-08-22 ENCOUNTER — Ambulatory Visit: Payer: BLUE CROSS/BLUE SHIELD

## 2018-08-22 ENCOUNTER — Ambulatory Visit
Admission: RE | Admit: 2018-08-22 | Discharge: 2018-08-22 | Disposition: A | Discharge status: Home / Self Care | Payer: BLUE CROSS/BLUE SHIELD | Source: Ambulatory Visit | Attending: Radiation Oncology | Admitting: Radiation Oncology

## 2018-08-22 ENCOUNTER — Other Ambulatory Visit: Payer: Self-pay

## 2018-08-22 DIAGNOSIS — Z510 Encounter for antineoplastic radiation therapy: Secondary | ICD-10-CM | POA: Diagnosis not present

## 2018-08-23 ENCOUNTER — Inpatient Hospital Stay (HOSPITAL_BASED_OUTPATIENT_CLINIC_OR_DEPARTMENT_OTHER): Payer: BLUE CROSS/BLUE SHIELD | Admitting: Oncology

## 2018-08-23 ENCOUNTER — Inpatient Hospital Stay: Payer: BLUE CROSS/BLUE SHIELD

## 2018-08-23 ENCOUNTER — Other Ambulatory Visit: Payer: Self-pay

## 2018-08-23 ENCOUNTER — Ambulatory Visit
Admission: RE | Admit: 2018-08-23 | Discharge: 2018-08-23 | Disposition: A | Discharge status: Home / Self Care | Payer: BLUE CROSS/BLUE SHIELD | Source: Ambulatory Visit | Attending: Radiation Oncology | Admitting: Radiation Oncology

## 2018-08-23 ENCOUNTER — Encounter: Payer: Self-pay | Admitting: Oncology

## 2018-08-23 VITALS — BP 162/96 | HR 59 | Temp 96.5°F | Ht 72.0 in | Wt 238.7 lb

## 2018-08-23 DIAGNOSIS — Z510 Encounter for antineoplastic radiation therapy: Secondary | ICD-10-CM | POA: Diagnosis present

## 2018-08-23 DIAGNOSIS — R110 Nausea: Secondary | ICD-10-CM | POA: Insufficient documentation

## 2018-08-23 DIAGNOSIS — C770 Secondary and unspecified malignant neoplasm of lymph nodes of head, face and neck: Secondary | ICD-10-CM

## 2018-08-23 DIAGNOSIS — C801 Malignant (primary) neoplasm, unspecified: Secondary | ICD-10-CM | POA: Insufficient documentation

## 2018-08-23 DIAGNOSIS — C779 Secondary and unspecified malignant neoplasm of lymph node, unspecified: Secondary | ICD-10-CM

## 2018-08-23 DIAGNOSIS — I2510 Atherosclerotic heart disease of native coronary artery without angina pectoris: Secondary | ICD-10-CM | POA: Insufficient documentation

## 2018-08-23 DIAGNOSIS — Z720 Tobacco use: Secondary | ICD-10-CM

## 2018-08-23 DIAGNOSIS — F419 Anxiety disorder, unspecified: Secondary | ICD-10-CM | POA: Insufficient documentation

## 2018-08-23 DIAGNOSIS — Z5111 Encounter for antineoplastic chemotherapy: Secondary | ICD-10-CM | POA: Insufficient documentation

## 2018-08-23 DIAGNOSIS — Z79899 Other long term (current) drug therapy: Secondary | ICD-10-CM | POA: Insufficient documentation

## 2018-08-23 LAB — BASIC METABOLIC PANEL WITH GFR
Anion gap: 7 (ref 5–15)
BUN: 18 mg/dL (ref 6–20)
CO2: 28 mmol/L (ref 22–32)
Calcium: 9.4 mg/dL (ref 8.9–10.3)
Chloride: 103 mmol/L (ref 98–111)
Creatinine, Ser: 0.92 mg/dL (ref 0.61–1.24)
GFR calc Af Amer: >60 mL/min
GFR calc non Af Amer: >60 mL/min
Glucose, Bld: 122 mg/dL — ABNORMAL HIGH (ref 70–99)
Potassium: 4.7 mmol/L (ref 3.5–5.1)
Sodium: 138 mmol/L (ref 135–145)

## 2018-08-23 LAB — CBC WITH DIFFERENTIAL/PLATELET
Abs Immature Granulocytes: 0.01 K/uL (ref 0.00–0.07)
Basophils Absolute: 0 K/uL (ref 0.0–0.1)
Basophils Relative: 1 %
Eosinophils Absolute: 0.1 K/uL (ref 0.0–0.5)
Eosinophils Relative: 2 %
HCT: 41.7 % (ref 39.0–52.0)
Hemoglobin: 14.1 g/dL (ref 13.0–17.0)
Immature Granulocytes: 0 %
Lymphocytes Relative: 37 %
Lymphs Abs: 2.2 K/uL (ref 0.7–4.0)
MCH: 29.8 pg (ref 26.0–34.0)
MCHC: 33.8 g/dL (ref 30.0–36.0)
MCV: 88.2 fL (ref 80.0–100.0)
Monocytes Absolute: 0.5 K/uL (ref 0.1–1.0)
Monocytes Relative: 8 %
Neutro Abs: 3.1 K/uL (ref 1.7–7.7)
Neutrophils Relative %: 52 %
Platelets: 176 K/uL (ref 150–400)
RBC: 4.73 MIL/uL (ref 4.22–5.81)
RDW: 12.7 % (ref 11.5–15.5)
WBC: 6 K/uL (ref 4.0–10.5)
nRBC: 0 % (ref 0.0–0.2)

## 2018-08-23 MED ORDER — SODIUM CHLORIDE 0.9 % IV SOLN
40.0000 mg/m2 | Freq: Once | INTRAVENOUS | Status: AC
  Administered 2018-08-23: 92 mg via INTRAVENOUS
  Filled 2018-08-23: qty 92

## 2018-08-23 MED ORDER — SODIUM CHLORIDE 0.9 % IV SOLN
Freq: Once | INTRAVENOUS | Status: AC
  Administered 2018-08-23: 12:00:00 via INTRAVENOUS
  Filled 2018-08-23: qty 5

## 2018-08-23 MED ORDER — PALONOSETRON HCL INJECTION 0.25 MG/5ML
0.2500 mg | Freq: Once | INTRAVENOUS | Status: AC
  Administered 2018-08-23: 12:00:00 0.25 mg via INTRAVENOUS
  Filled 2018-08-23: qty 5

## 2018-08-23 MED ORDER — SODIUM CHLORIDE 0.9 % IV SOLN
Freq: Once | INTRAVENOUS | Status: AC
  Administered 2018-08-23: 10:00:00 via INTRAVENOUS
  Filled 2018-08-23: qty 250

## 2018-08-23 MED ORDER — POTASSIUM CHLORIDE 2 MEQ/ML IV SOLN
Freq: Once | INTRAVENOUS | Status: AC
  Administered 2018-08-23: 10:00:00 via INTRAVENOUS
  Filled 2018-08-23: qty 1000

## 2018-08-23 NOTE — Progress Notes (Signed)
Patient stated that he had been doing well with no complaints. 

## 2018-08-23 NOTE — Progress Notes (Signed)
Per Herb Grays CMA per Dr. Grayland Ormond okay to proceed with treatment with B/P 162/96.

## 2018-08-26 ENCOUNTER — Ambulatory Visit
Admission: RE | Admit: 2018-08-26 | Discharge: 2018-08-26 | Disposition: A | Discharge status: Home / Self Care | Payer: BLUE CROSS/BLUE SHIELD | Source: Ambulatory Visit | Attending: Radiation Oncology | Admitting: Radiation Oncology

## 2018-08-26 ENCOUNTER — Other Ambulatory Visit: Payer: Self-pay

## 2018-08-26 DIAGNOSIS — Z510 Encounter for antineoplastic radiation therapy: Secondary | ICD-10-CM | POA: Diagnosis not present

## 2018-08-27 ENCOUNTER — Ambulatory Visit
Admission: RE | Admit: 2018-08-27 | Discharge: 2018-08-27 | Disposition: A | Discharge status: Home / Self Care | Payer: BLUE CROSS/BLUE SHIELD | Source: Ambulatory Visit | Attending: Radiation Oncology | Admitting: Radiation Oncology

## 2018-08-27 ENCOUNTER — Other Ambulatory Visit: Payer: Self-pay

## 2018-08-27 ENCOUNTER — Telehealth: Payer: Self-pay | Admitting: *Deleted

## 2018-08-27 DIAGNOSIS — Z510 Encounter for antineoplastic radiation therapy: Secondary | ICD-10-CM | POA: Diagnosis not present

## 2018-08-27 NOTE — Telephone Encounter (Signed)
Yes that is fine  Thanks   

## 2018-08-27 NOTE — Telephone Encounter (Signed)
Wife Suanne Marker called reporting patient is have acid reflux and wants to know if he can take Prilosec daily for it. Also report that he is "queezy" and will start taking his antiemetics for that. Please advise re Prilosec

## 2018-08-27 NOTE — Telephone Encounter (Signed)
Call returned to rhinda and she repeated back that patient will take Prilosec daily

## 2018-08-27 NOTE — Progress Notes (Signed)
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  He is continuing with daily XRT as well.  Patient had increased nausea after his infusion last week, but otherwise tolerated it well.  He also has increased anxiety. He has no neurologic complaints. He denies any recent fevers or illnesses.  He has a good appetite and denies weight loss.  He denies any dysphasia.  He has no chest pain, shortness of breath, cough, or hemoptysis.  He denies any vomiting, constipation, or diarrhea.  He has no urinary complaints.  Patient offers no further specific complaints today.  REVIEW OF SYSTEMS:   Review of Systems  Constitutional: Negative.  Negative for fever, malaise/fatigue and weight loss.  Respiratory: Negative.  Negative for cough and hemoptysis.   Cardiovascular: Negative.  Negative for chest pain and leg swelling.  Gastrointestinal: Negative.  Negative for abdominal pain.  Genitourinary: Negative.  Negative for dysuria.  Musculoskeletal: Negative.  Negative for neck pain.  Skin: Negative.  Negative for rash.  Neurological: Negative.  Negative for dizziness, speech change, focal weakness, weakness and headaches.  Psychiatric/Behavioral: Negative.  The patient is not nervous/anxious.     As per HPI. Otherwise, a complete review of systems is negative.  PAST MEDICAL HISTORY: No past medical history on file.  PAST SURGICAL HISTORY: Past Surgical History:  Procedure Laterality Date  . APPENDECTOMY    . THROAT SURGERY Right     FAMILY HISTORY: Family History  Problem Relation Age of Onset  . Aortic aneurysm Father      ADVANCED DIRECTIVES (Y/N):  N  HEALTH MAINTENANCE: Social History   Tobacco Use  . Smoking status: Never Smoker  . Smokeless tobacco: Current User    Types: Chew  Substance Use Topics  . Alcohol use: Yes    Alcohol/week: 0.0 standard drinks    Comment: occasional  . Drug use: No     Colonoscopy:  PAP:  Bone density:  Lipid panel:  No Known Allergies  Current Outpatient Medications  Medication Sig Dispense Refill  . Multiple Vitamins-Minerals (MULTIVITAMIN ADULT PO) Take 1 tablet by mouth 1 day or 1 dose.    . Omega-3 Fatty Acids (FISH OIL) 1000 MG CAPS Take 1 capsule by mouth daily.    . ondansetron (ZOFRAN) 8 MG tablet Take 1 tablet (8 mg total) by mouth 2 (two) times daily as needed. Start on the third day after chemotherapy. 30 tablet 1  . prochlorperazine (COMPAZINE) 10 MG tablet Take 1 tablet (10 mg total) by mouth every 6 (six) hours as needed (Nausea or vomiting). 60 tablet 1  . promethazine (PHENERGAN) 25 MG tablet Take 1 tablet (25 mg total) by mouth every 6 (six) hours as needed for nausea or vomiting. 30 tablet 0   No current facility-administered medications for this visit.    Facility-Administered Medications Ordered in Other Visits  Medication Dose Route Frequency Provider Last Rate Last Dose  . CISplatin (PLATINOL) 92 mg in sodium chloride 0.9 % 250 mL chemo infusion  40 mg/m2 (Treatment Plan Recorded) Intravenous Once Lloyd Huger, MD      . fosaprepitant (EMEND) 150 mg, dexamethasone (DECADRON) 12 mg in sodium chloride  0.9 % 145 mL IVPB   Intravenous Once Lloyd Huger, MD      . heparin lock flush 100 unit/mL  500 Units Intracatheter Once PRN Lloyd Huger, MD      . palonosetron (ALOXI) injection 0.25 mg  0.25 mg Intravenous Once Lloyd Huger, MD        OBJECTIVE: Vitals:   08/30/18 0844  BP: 134/87  Pulse: 70  Temp: 98.1 F (36.7 C)     Body mass index is 31.72 kg/m.    ECOG FS:0 - Asymptomatic  General: Well-developed,  well-nourished, no acute distress. Eyes: Pink conjunctiva, anicteric sclera. HEENT: Normocephalic, moist mucous membranes, clear oropharnyx.  Easily palpable lymph node on left neck. Lungs: Clear to auscultation bilaterally. Heart: Regular rate and rhythm. No rubs, murmurs, or gallops. Abdomen: Soft, nontender, nondistended. No organomegaly noted, normoactive bowel sounds. Musculoskeletal: No edema, cyanosis, or clubbing. Neuro: Alert, answering all questions appropriately. Cranial nerves grossly intact. Skin: No rashes or petechiae noted. Psych: Normal affect.  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 EXAM: NUCLEAR MEDICINE PET SKULL BASE TO THIGH TECHNIQUE: 13.13 mCi F-18 FDG was injected intravenously. Full-ring PET imaging was performed from the skull base to thigh after the radiotracer. CT data was obtained and used for attenuation correction and anatomic localization. Fasting blood glucose: 123 mg/dl COMPARISON:  CT neck 07/17/2018. Noncontrast abdominopelvic CT 09/13/2007. FINDINGS: Mediastinal blood pool activity: SUV max 1.8 NECK: The previously biopsied left level 2 node is not hypermetabolic. This node measures 2.4 x 2.1 cm on image 47/3. Metabolic activity is greatest inferiorly and near blood pool (SUV max 3.2).No obvious primary  malignancy is identified within the pharyngeal mucosal space. Palatine tonsillar activity appears symmetric. A small focus of activity in the floor of the right maxillary sinus appears periodontal. There is mildly asymmetric activity along the right aspect of the arytenoid cartilage (SUV max 5.3) without obvious corresponding mucosal abnormality or cartilage destruction. There is no abnormal salivary gland activity. Incidental CT findings: none CHEST: There are no hypermetabolic mediastinal, hilar or axillary lymph nodes. There is no hypermetabolic pulmonary activity. Incidental CT findings: Three-vessel coronary artery atherosclerosis. The lungs are clear. ABDOMEN/PELVIS: There is no hypermetabolic activity within the liver, adrenal glands, spleen or pancreas. There is no hypermetabolic nodal activity. Incidental CT findings: Diffuse hepatic steatosis. Mild sigmoid diverticulosis. SKELETON: There is no hypermetabolic activity to suggest osseous metastatic disease. Incidental CT findings: none IMPRESSION: 1. There is no hypermetabolic activity associated with the biopsied node in the left neck or elsewhere in the neck, chest, abdomen or pelvis. 2. No obvious primary malignancy within the pharyngeal mucosal space. Palatine tonsillar activity appears symmetric. There is a small focus of asymmetric activity along the arytenoid cartilage  on the right without obvious mucosal lesion. 3. No abnormal metabolic activity in the chest, abdomen or pelvis. 4. Hepatic steatosis. Electronically Signed   By: Richardean Sale M.D.   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  Case was also discussed at length with ENT and the tumor board.  PET scan results from August 06, 2018 reviewed independently and report as  above with no obvious hypermetabolic activity in positive lymph node or other area to suggest a primary.  Continue daily XRT.  Proceed with cycle 2 of weekly cisplatin.  Return to clinic in 1 week for further evaluation and consideration of cycle 3.   2.  Nausea: Patient has been instructed to take his prescribed medication scheduled rather than as needed. 3.  Anxiety: Patient was called in a prescription for Xanax today.  I spent a total of 30 minutes face-to-face with the patient of which greater than 50% of the visit was spent in counseling and coordination of care as detailed above.   Patient expressed understanding and was in agreement with this plan. He also understands that He can call clinic at any time with any questions, concerns, or complaints.   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

## 2018-08-28 ENCOUNTER — Telehealth: Payer: Self-pay | Admitting: *Deleted

## 2018-08-28 ENCOUNTER — Ambulatory Visit
Admission: RE | Admit: 2018-08-28 | Discharge: 2018-08-28 | Disposition: A | Discharge status: Home / Self Care | Payer: BLUE CROSS/BLUE SHIELD | Source: Ambulatory Visit | Attending: Radiation Oncology | Admitting: Radiation Oncology

## 2018-08-28 ENCOUNTER — Other Ambulatory Visit: Payer: Self-pay

## 2018-08-28 DIAGNOSIS — Z510 Encounter for antineoplastic radiation therapy: Secondary | ICD-10-CM | POA: Diagnosis not present

## 2018-08-28 MED ORDER — PROMETHAZINE HCL 25 MG PO TABS: 25.0000 mg | ORAL_TABLET | Freq: Four times a day (QID) | ORAL | 0 refills | Status: DC | PRN

## 2018-08-28 NOTE — Telephone Encounter (Signed)
Wife called reporting that patient is using Zofran. Compazine mixture and is still feeling nauseated. He is not vomiting and does not need to come in to be seen, but wanted to know if something else can be ordered for his nausea. Please advise

## 2018-08-28 NOTE — Telephone Encounter (Signed)
Phenergan 25mg  q6hrs prn might work.  Thanks!

## 2018-08-28 NOTE — Telephone Encounter (Signed)
Wife informed prescription sent to pharmacy

## 2018-08-29 ENCOUNTER — Other Ambulatory Visit: Payer: Self-pay

## 2018-08-29 ENCOUNTER — Ambulatory Visit
Admission: RE | Admit: 2018-08-29 | Discharge: 2018-08-29 | Disposition: A | Discharge status: Home / Self Care | Payer: BLUE CROSS/BLUE SHIELD | Source: Ambulatory Visit | Attending: Radiation Oncology | Admitting: Radiation Oncology

## 2018-08-29 DIAGNOSIS — Z510 Encounter for antineoplastic radiation therapy: Secondary | ICD-10-CM | POA: Diagnosis not present

## 2018-08-30 ENCOUNTER — Other Ambulatory Visit: Payer: Self-pay

## 2018-08-30 ENCOUNTER — Inpatient Hospital Stay: Payer: BLUE CROSS/BLUE SHIELD

## 2018-08-30 ENCOUNTER — Ambulatory Visit
Admission: RE | Admit: 2018-08-30 | Discharge: 2018-08-30 | Disposition: A | Discharge status: Home / Self Care | Payer: BLUE CROSS/BLUE SHIELD | Source: Ambulatory Visit | Attending: Radiation Oncology | Admitting: Radiation Oncology

## 2018-08-30 ENCOUNTER — Inpatient Hospital Stay (HOSPITAL_BASED_OUTPATIENT_CLINIC_OR_DEPARTMENT_OTHER): Payer: BLUE CROSS/BLUE SHIELD | Admitting: Oncology

## 2018-08-30 VITALS — BP 134/87 | HR 70 | Temp 98.1°F | Wt 233.9 lb

## 2018-08-30 DIAGNOSIS — I2510 Atherosclerotic heart disease of native coronary artery without angina pectoris: Secondary | ICD-10-CM

## 2018-08-30 DIAGNOSIS — F419 Anxiety disorder, unspecified: Secondary | ICD-10-CM

## 2018-08-30 DIAGNOSIS — C779 Secondary and unspecified malignant neoplasm of lymph node, unspecified: Secondary | ICD-10-CM

## 2018-08-30 DIAGNOSIS — R110 Nausea: Secondary | ICD-10-CM

## 2018-08-30 DIAGNOSIS — C770 Secondary and unspecified malignant neoplasm of lymph nodes of head, face and neck: Secondary | ICD-10-CM

## 2018-08-30 DIAGNOSIS — C801 Malignant (primary) neoplasm, unspecified: Secondary | ICD-10-CM

## 2018-08-30 DIAGNOSIS — Z510 Encounter for antineoplastic radiation therapy: Secondary | ICD-10-CM | POA: Diagnosis not present

## 2018-08-30 LAB — CBC WITH DIFFERENTIAL/PLATELET
Abs Immature Granulocytes: 0.03 10*3/uL (ref 0.00–0.07)
Basophils Absolute: 0 10*3/uL (ref 0.0–0.1)
Basophils Relative: 0 %
Eosinophils Absolute: 0.1 10*3/uL (ref 0.0–0.5)
Eosinophils Relative: 1 %
HCT: 43.2 % (ref 39.0–52.0)
Hemoglobin: 14.6 g/dL (ref 13.0–17.0)
Immature Granulocytes: 0 %
Lymphocytes Relative: 19 %
Lymphs Abs: 1.3 10*3/uL (ref 0.7–4.0)
MCH: 29.4 pg (ref 26.0–34.0)
MCHC: 33.8 g/dL (ref 30.0–36.0)
MCV: 87.1 fL (ref 80.0–100.0)
Monocytes Absolute: 0.6 10*3/uL (ref 0.1–1.0)
Monocytes Relative: 8 %
Neutro Abs: 5 10*3/uL (ref 1.7–7.7)
Neutrophils Relative %: 72 %
Platelets: 176 10*3/uL (ref 150–400)
RBC: 4.96 MIL/uL (ref 4.22–5.81)
RDW: 12.6 % (ref 11.5–15.5)
WBC: 7 10*3/uL (ref 4.0–10.5)
nRBC: 0 % (ref 0.0–0.2)

## 2018-08-30 LAB — BASIC METABOLIC PANEL
Anion gap: 9 (ref 5–15)
BUN: 21 mg/dL — ABNORMAL HIGH (ref 6–20)
CO2: 26 mmol/L (ref 22–32)
Calcium: 9.4 mg/dL (ref 8.9–10.3)
Chloride: 99 mmol/L (ref 98–111)
Creatinine, Ser: 1.02 mg/dL (ref 0.61–1.24)
GFR calc Af Amer: >60 mL/min (ref >60)
GFR calc non Af Amer: >60 mL/min (ref >60)
Glucose, Bld: 151 mg/dL — ABNORMAL HIGH (ref 70–99)
Potassium: 4.7 mmol/L (ref 3.5–5.1)
Sodium: 134 mmol/L — ABNORMAL LOW (ref 135–145)

## 2018-08-30 MED ORDER — ALPRAZOLAM 0.25 MG PO TABS: 0.2500 mg | ORAL_TABLET | Freq: Every day | ORAL | 0 refills | Status: DC | PRN

## 2018-08-30 MED ORDER — POTASSIUM CHLORIDE 2 MEQ/ML IV SOLN
Freq: Once | INTRAVENOUS | Status: AC
  Administered 2018-08-30: 10:00:00 via INTRAVENOUS
  Filled 2018-08-30: qty 1000

## 2018-08-30 MED ORDER — SODIUM CHLORIDE 0.9 % IV SOLN
40.0000 mg/m2 | Freq: Once | INTRAVENOUS | Status: AC
  Administered 2018-08-30: 13:00:00 92 mg via INTRAVENOUS
  Filled 2018-08-30: qty 92

## 2018-08-30 MED ORDER — PALONOSETRON HCL INJECTION 0.25 MG/5ML
0.2500 mg | Freq: Once | INTRAVENOUS | Status: AC
  Administered 2018-08-30: 12:00:00 0.25 mg via INTRAVENOUS
  Filled 2018-08-30: qty 5

## 2018-08-30 MED ORDER — SODIUM CHLORIDE 0.9 % IV SOLN
Freq: Once | INTRAVENOUS | Status: AC
  Administered 2018-08-30: 10:00:00 via INTRAVENOUS
  Filled 2018-08-30: qty 250

## 2018-08-30 MED ORDER — SODIUM CHLORIDE 0.9 % IV SOLN
Freq: Once | INTRAVENOUS | Status: AC
  Administered 2018-08-30: 12:00:00 via INTRAVENOUS
  Filled 2018-08-30: qty 5

## 2018-08-30 MED ORDER — HEPARIN SOD (PORK) LOCK FLUSH 100 UNIT/ML IV SOLN: 500.0000 [IU] | Freq: Once | INTRAVENOUS | Status: DC | PRN

## 2018-08-30 NOTE — Progress Notes (Signed)
Patient reports having a feeling of constant nausea with stomach feeling "hunger pains".  The prescribed nausea meds are not helping.

## 2018-09-02 ENCOUNTER — Other Ambulatory Visit: Payer: Self-pay

## 2018-09-02 ENCOUNTER — Ambulatory Visit
Admission: RE | Admit: 2018-09-02 | Discharge: 2018-09-02 | Disposition: A | Discharge status: Home / Self Care | Payer: BLUE CROSS/BLUE SHIELD | Source: Ambulatory Visit | Attending: Radiation Oncology | Admitting: Radiation Oncology

## 2018-09-02 DIAGNOSIS — Z510 Encounter for antineoplastic radiation therapy: Secondary | ICD-10-CM | POA: Diagnosis not present

## 2018-09-03 ENCOUNTER — Other Ambulatory Visit: Payer: Self-pay | Admitting: *Deleted

## 2018-09-03 ENCOUNTER — Ambulatory Visit
Admission: RE | Admit: 2018-09-03 | Discharge: 2018-09-03 | Disposition: A | Discharge status: Home / Self Care | Payer: BLUE CROSS/BLUE SHIELD | Source: Ambulatory Visit | Attending: Radiation Oncology | Admitting: Radiation Oncology

## 2018-09-03 ENCOUNTER — Other Ambulatory Visit: Payer: Self-pay

## 2018-09-03 DIAGNOSIS — Z510 Encounter for antineoplastic radiation therapy: Secondary | ICD-10-CM | POA: Diagnosis not present

## 2018-09-03 MED ORDER — SUCRALFATE 1 G PO TABS: 1.0000 g | ORAL_TABLET | Freq: Three times a day (TID) | ORAL | 6 refills | Status: DC

## 2018-09-04 ENCOUNTER — Ambulatory Visit
Admission: RE | Admit: 2018-09-04 | Discharge: 2018-09-04 | Disposition: A | Discharge status: Home / Self Care | Payer: BLUE CROSS/BLUE SHIELD | Source: Ambulatory Visit | Attending: Radiation Oncology | Admitting: Radiation Oncology

## 2018-09-04 ENCOUNTER — Other Ambulatory Visit: Payer: Self-pay

## 2018-09-04 ENCOUNTER — Telehealth: Payer: Self-pay | Admitting: *Deleted

## 2018-09-04 DIAGNOSIS — Z510 Encounter for antineoplastic radiation therapy: Secondary | ICD-10-CM | POA: Diagnosis not present

## 2018-09-04 NOTE — Telephone Encounter (Signed)
Patients wife, Suanne Marker, called saying that pt is having body aches, she has been giving him tylenol and advil, this is not controlling pain, asking for an prescription for tylenol 3.

## 2018-09-05 ENCOUNTER — Other Ambulatory Visit: Payer: Self-pay

## 2018-09-05 ENCOUNTER — Other Ambulatory Visit: Payer: Self-pay | Admitting: *Deleted

## 2018-09-05 ENCOUNTER — Ambulatory Visit
Admission: RE | Admit: 2018-09-05 | Discharge: 2018-09-05 | Disposition: A | Discharge status: Home / Self Care | Payer: BLUE CROSS/BLUE SHIELD | Source: Ambulatory Visit | Attending: Radiation Oncology | Admitting: Radiation Oncology

## 2018-09-05 DIAGNOSIS — Z510 Encounter for antineoplastic radiation therapy: Secondary | ICD-10-CM | POA: Diagnosis not present

## 2018-09-05 MED ORDER — ACETAMINOPHEN-CODEINE #3 300-30 MG PO TABS: 1.0000 | ORAL_TABLET | ORAL | 0 refills | Status: DC | PRN

## 2018-09-05 MED ORDER — DEXAMETHASONE 4 MG PO TABS: 2.0000 mg | ORAL_TABLET | Freq: Two times a day (BID) | ORAL | 0 refills | Status: DC

## 2018-09-05 NOTE — Telephone Encounter (Signed)
Sure

## 2018-09-05 NOTE — Telephone Encounter (Signed)
RX sent to you for e-scribing. Thanks

## 2018-09-05 NOTE — Progress Notes (Signed)
Entered in error

## 2018-09-06 ENCOUNTER — Other Ambulatory Visit: Payer: Self-pay

## 2018-09-06 ENCOUNTER — Inpatient Hospital Stay: Payer: BLUE CROSS/BLUE SHIELD

## 2018-09-06 ENCOUNTER — Ambulatory Visit
Admission: RE | Admit: 2018-09-06 | Discharge: 2018-09-06 | Disposition: A | Discharge status: Home / Self Care | Payer: BLUE CROSS/BLUE SHIELD | Source: Ambulatory Visit | Attending: Radiation Oncology | Admitting: Radiation Oncology

## 2018-09-06 ENCOUNTER — Inpatient Hospital Stay (HOSPITAL_BASED_OUTPATIENT_CLINIC_OR_DEPARTMENT_OTHER): Payer: BLUE CROSS/BLUE SHIELD | Admitting: Oncology

## 2018-09-06 VITALS — BP 136/91 | HR 60 | Temp 96.5°F | Resp 18 | Wt 233.0 lb

## 2018-09-06 DIAGNOSIS — R5383 Other fatigue: Secondary | ICD-10-CM | POA: Diagnosis not present

## 2018-09-06 DIAGNOSIS — C770 Secondary and unspecified malignant neoplasm of lymph nodes of head, face and neck: Secondary | ICD-10-CM | POA: Diagnosis not present

## 2018-09-06 DIAGNOSIS — C801 Malignant (primary) neoplasm, unspecified: Secondary | ICD-10-CM | POA: Diagnosis not present

## 2018-09-06 DIAGNOSIS — Z510 Encounter for antineoplastic radiation therapy: Secondary | ICD-10-CM | POA: Diagnosis not present

## 2018-09-06 DIAGNOSIS — C779 Secondary and unspecified malignant neoplasm of lymph node, unspecified: Secondary | ICD-10-CM

## 2018-09-06 DIAGNOSIS — Z5111 Encounter for antineoplastic chemotherapy: Secondary | ICD-10-CM

## 2018-09-06 LAB — CBC WITH DIFFERENTIAL/PLATELET
Abs Immature Granulocytes: 0.04 10*3/uL (ref 0.00–0.07)
Basophils Absolute: 0 10*3/uL (ref 0.0–0.1)
Basophils Relative: 0 %
Eosinophils Absolute: 0 10*3/uL (ref 0.0–0.5)
Eosinophils Relative: 0 %
HCT: 37.1 % — ABNORMAL LOW (ref 39.0–52.0)
Hemoglobin: 13.1 g/dL (ref 13.0–17.0)
Immature Granulocytes: 0 %
Lymphocytes Relative: 6 %
Lymphs Abs: 0.6 10*3/uL — ABNORMAL LOW (ref 0.7–4.0)
MCH: 29.6 pg (ref 26.0–34.0)
MCHC: 35.3 g/dL (ref 30.0–36.0)
MCV: 83.9 fL (ref 80.0–100.0)
Monocytes Absolute: 0.8 10*3/uL (ref 0.1–1.0)
Monocytes Relative: 8 %
Neutro Abs: 8.2 10*3/uL — ABNORMAL HIGH (ref 1.7–7.7)
Neutrophils Relative %: 86 %
Platelets: 182 10*3/uL (ref 150–400)
RBC: 4.42 MIL/uL (ref 4.22–5.81)
RDW: 12.1 % (ref 11.5–15.5)
WBC: 9.6 10*3/uL (ref 4.0–10.5)
nRBC: 0 % (ref 0.0–0.2)

## 2018-09-06 LAB — BASIC METABOLIC PANEL
Anion gap: 10 (ref 5–15)
BUN: 22 mg/dL — ABNORMAL HIGH (ref 6–20)
CO2: 25 mmol/L (ref 22–32)
Calcium: 9.1 mg/dL (ref 8.9–10.3)
Chloride: 96 mmol/L — ABNORMAL LOW (ref 98–111)
Creatinine, Ser: 0.86 mg/dL (ref 0.61–1.24)
GFR calc Af Amer: >60 mL/min (ref >60)
GFR calc non Af Amer: >60 mL/min (ref >60)
Glucose, Bld: 118 mg/dL — ABNORMAL HIGH (ref 70–99)
Potassium: 4.6 mmol/L (ref 3.5–5.1)
Sodium: 131 mmol/L — ABNORMAL LOW (ref 135–145)

## 2018-09-06 MED ORDER — SODIUM CHLORIDE 0.9 % IV SOLN
Freq: Once | INTRAVENOUS | Status: AC
  Administered 2018-09-06: 10:00:00 via INTRAVENOUS
  Filled 2018-09-06: qty 250

## 2018-09-06 MED ORDER — PALONOSETRON HCL INJECTION 0.25 MG/5ML
0.2500 mg | Freq: Once | INTRAVENOUS | Status: AC
  Administered 2018-09-06: 13:00:00 0.25 mg via INTRAVENOUS
  Filled 2018-09-06: qty 5

## 2018-09-06 MED ORDER — SODIUM CHLORIDE 0.9 % IV SOLN
40.0000 mg/m2 | Freq: Once | INTRAVENOUS | Status: AC
  Administered 2018-09-06: 14:00:00 92 mg via INTRAVENOUS
  Filled 2018-09-06: qty 92

## 2018-09-06 MED ORDER — SODIUM CHLORIDE 0.9 % IV SOLN
Freq: Once | INTRAVENOUS | Status: AC
  Administered 2018-09-06: 13:00:00 via INTRAVENOUS
  Filled 2018-09-06: qty 5

## 2018-09-06 MED ORDER — POTASSIUM CHLORIDE 2 MEQ/ML IV SOLN
Freq: Once | INTRAVENOUS | Status: AC
  Administered 2018-09-06: 11:00:00 via INTRAVENOUS
  Filled 2018-09-06: qty 1000

## 2018-09-06 NOTE — Progress Notes (Signed)
Patient reports that the constant nausea has improved.  He did get the rx for Tylenol #3 prescribed by Dr. Grayland Ormond but has not had to take any.  No new concerns today.

## 2018-09-09 ENCOUNTER — Ambulatory Visit
Admission: RE | Admit: 2018-09-09 | Discharge: 2018-09-09 | Disposition: A | Discharge status: Home / Self Care | Payer: BLUE CROSS/BLUE SHIELD | Source: Ambulatory Visit | Attending: Radiation Oncology | Admitting: Radiation Oncology

## 2018-09-09 ENCOUNTER — Other Ambulatory Visit: Payer: Self-pay

## 2018-09-09 ENCOUNTER — Encounter: Payer: Self-pay | Admitting: Oncology

## 2018-09-09 DIAGNOSIS — Z510 Encounter for antineoplastic radiation therapy: Secondary | ICD-10-CM | POA: Diagnosis not present

## 2018-09-09 NOTE — Progress Notes (Signed)
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  PET CT scan did not show any evidence of hypermetabolic activity anywhere.  He sees Dr. Grayland Ormond as an outpatient and has been currently undergoing concurrent chemoradiation.  Interval history-overall patient is doing well and denies any specific complaints at this time.  He has some mild fatigue.  He is able to swallow solids and liquids well and he is keeping up with his fluid intake.  Denies any pain during swallowing.  Denies any tingling numbness in his extremities  ECOG PS- 0 Pain scale- 0 Opioid associated constipation- no  Review of systems- Review of Systems  Constitutional: Negative for chills, fever, malaise/fatigue and weight loss.  HENT: Negative for congestion, ear discharge and nosebleeds.   Eyes: Negative for blurred vision.  Respiratory: Negative for cough, hemoptysis, sputum production, shortness of breath and wheezing.   Cardiovascular: Negative for chest pain, palpitations, orthopnea and claudication.  Gastrointestinal: Negative for abdominal pain, blood in stool, constipation, diarrhea, heartburn, melena, nausea and vomiting.  Genitourinary: Negative for dysuria, flank pain, frequency, hematuria and urgency.  Musculoskeletal: Negative for back pain, joint pain and myalgias.  Skin: Negative for rash.  Neurological: Negative for dizziness,  tingling, focal weakness, seizures, weakness and headaches.  Endo/Heme/Allergies: Does not bruise/bleed easily.  Psychiatric/Behavioral: Negative for depression and suicidal ideas. The patient does not have insomnia.        No Known Allergies   No past medical history on file.   Past Surgical History:  Procedure Laterality Date  . APPENDECTOMY    . THROAT SURGERY Right     Social History   Socioeconomic History  . Marital status: Married    Spouse name: Not on file  . Number of children: Not on file  . Years of education: Not on file  . Highest education level: Not on file  Occupational History  . Not on file  Social Needs  . Financial resource strain: Not on file  . Food insecurity:    Worry: Not on file    Inability: Not on file  . Transportation needs:    Medical: Not on file    Non-medical: Not on file  Tobacco Use  . Smoking status: Never Smoker  . Smokeless tobacco: Current User    Types: Chew  Substance and Sexual Activity  . Alcohol use: Yes    Alcohol/week: 0.0 standard drinks    Comment: occasional  . Drug use: No  . Sexual activity: Yes    Partners: Female  Lifestyle  . Physical activity:    Days per week: Not on file    Minutes per session: Not on file  . Stress: Not on file  Relationships  . Social connections:    Talks on phone: Not on file    Gets together: Not on file    Attends religious service: Not on file    Active member of club or organization: Not on file  Attends meetings of clubs or organizations: Not on file    Relationship status: Not on file  . Intimate partner violence:    Fear of current or ex partner: Not on file    Emotionally abused: Not on file    Physically abused: Not on file    Forced sexual activity: Not on file  Other Topics Concern  . Not on file  Social History Narrative  . Not on file    Family History  Problem Relation Age of Onset  . Aortic aneurysm Father      Current Outpatient Medications:   .  acetaminophen-codeine (TYLENOL #3) 300-30 MG tablet, Take 1 tablet by mouth every 4 (four) hours as needed for moderate pain., Disp: 30 tablet, Rfl: 0 .  ALPRAZolam (XANAX) 0.25 MG tablet, Take 1 tablet (0.25 mg total) by mouth daily as needed for anxiety., Disp: 15 tablet, Rfl: 0 .  dexamethasone (DECADRON) 4 MG tablet, Take 0.5 tablets (2 mg total) by mouth 2 (two) times daily., Disp: 30 tablet, Rfl: 0 .  Multiple Vitamins-Minerals (MULTIVITAMIN ADULT PO), Take 1 tablet by mouth 1 day or 1 dose., Disp: , Rfl:  .  Omega-3 Fatty Acids (FISH OIL) 1000 MG CAPS, Take 1 capsule by mouth daily., Disp: , Rfl:  .  ondansetron (ZOFRAN) 8 MG tablet, Take 1 tablet (8 mg total) by mouth 2 (two) times daily as needed. Start on the third day after chemotherapy., Disp: 30 tablet, Rfl: 1 .  prochlorperazine (COMPAZINE) 10 MG tablet, Take 1 tablet (10 mg total) by mouth every 6 (six) hours as needed (Nausea or vomiting)., Disp: 60 tablet, Rfl: 1 .  promethazine (PHENERGAN) 25 MG tablet, Take 1 tablet (25 mg total) by mouth every 6 (six) hours as needed for nausea or vomiting., Disp: 30 tablet, Rfl: 0 .  sucralfate (CARAFATE) 1 g tablet, Take 1 tablet (1 g total) by mouth 3 (three) times daily before meals. Dissolve in warm water, swish and swallow, Disp: 90 tablet, Rfl: 6  Physical exam:  Vitals:   09/06/18 0854  BP: (!) 136/91  Pulse: 60  Resp: 18  Temp: (!) 96.5 F (35.8 C)  Weight: 233 lb (105.7 kg)   Physical Exam Constitutional:      General: He is not in acute distress. HENT:     Head: Normocephalic and atraumatic.  Eyes:     Pupils: Pupils are equal, round, and reactive to light.  Neck:     Musculoskeletal: Normal range of motion.  Cardiovascular:     Rate and Rhythm: Normal rate and regular rhythm.     Heart sounds: Normal heart sounds.  Pulmonary:     Effort: Pulmonary effort is normal.     Breath sounds: Normal breath sounds.  Abdominal:     General: Bowel sounds are normal.      Palpations: Abdomen is soft.  Skin:    General: Skin is warm and dry.  Neurological:     Mental Status: He is alert and oriented to person, place, and time.      CMP Latest Ref Rng & Units 09/06/2018  Glucose 70 - 99 mg/dL 118(H)  BUN 6 - 20 mg/dL 22(H)  Creatinine 0.61 - 1.24 mg/dL 0.86  Sodium 135 - 145 mmol/L 131(L)  Potassium 3.5 - 5.1 mmol/L 4.6  Chloride 98 - 111 mmol/L 96(L)  CO2 22 - 32 mmol/L 25  Calcium 8.9 - 10.3 mg/dL 9.1   CBC Latest Ref Rng & Units 09/06/2018  WBC  4.0 - 10.5 K/uL 9.6  Hemoglobin 13.0 - 17.0 g/dL 13.1  Hematocrit 39.0 - 52.0 % 37.1(L)  Platelets 150 - 400 K/uL 182    Assessment and plan- Patient is a 58 y.o. 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  He is here for on treatment assessment prior to cycle 3 of weekly cisplatin.  Counts okay to proceed with cycle 3 of weekly cisplatin chemotherapy today.  He will return to clinic in 1 week's time to see Dr. Grayland Ormond and get a CBC and a BMP at that time.  Anxiety: Continue Xanax as needed   Visit Diagnosis 1. Encounter for antineoplastic chemotherapy   2. 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 Randa Evens, MD, MPH [facility] [phone] 09/09/2018 8:59 AM

## 2018-09-10 ENCOUNTER — Other Ambulatory Visit: Payer: Self-pay

## 2018-09-10 ENCOUNTER — Ambulatory Visit
Admission: RE | Admit: 2018-09-10 | Discharge: 2018-09-10 | Disposition: A | Discharge status: Home / Self Care | Payer: BLUE CROSS/BLUE SHIELD | Source: Ambulatory Visit | Attending: Radiation Oncology | Admitting: Radiation Oncology

## 2018-09-10 ENCOUNTER — Encounter: Payer: Self-pay | Admitting: *Deleted

## 2018-09-10 DIAGNOSIS — Z510 Encounter for antineoplastic radiation therapy: Secondary | ICD-10-CM | POA: Diagnosis not present

## 2018-09-11 ENCOUNTER — Other Ambulatory Visit: Payer: Self-pay | Admitting: *Deleted

## 2018-09-11 ENCOUNTER — Other Ambulatory Visit: Payer: Self-pay

## 2018-09-11 ENCOUNTER — Ambulatory Visit
Admission: RE | Admit: 2018-09-11 | Discharge: 2018-09-11 | Disposition: A | Discharge status: Home / Self Care | Payer: BLUE CROSS/BLUE SHIELD | Source: Ambulatory Visit | Attending: Radiation Oncology | Admitting: Radiation Oncology

## 2018-09-11 DIAGNOSIS — C801 Malignant (primary) neoplasm, unspecified: Secondary | ICD-10-CM

## 2018-09-11 DIAGNOSIS — Z510 Encounter for antineoplastic radiation therapy: Secondary | ICD-10-CM | POA: Diagnosis not present

## 2018-09-11 DIAGNOSIS — C779 Secondary and unspecified malignant neoplasm of lymph node, unspecified: Secondary | ICD-10-CM

## 2018-09-11 MED ORDER — PROCHLORPERAZINE MALEATE 10 MG PO TABS: 10.0000 mg | ORAL_TABLET | Freq: Four times a day (QID) | ORAL | 1 refills | Status: DC | PRN

## 2018-09-11 MED ORDER — ONDANSETRON HCL 8 MG PO TABS: 8.0000 mg | ORAL_TABLET | Freq: Two times a day (BID) | ORAL | 1 refills | Status: DC | PRN

## 2018-09-12 ENCOUNTER — Ambulatory Visit
Admission: RE | Admit: 2018-09-12 | Discharge: 2018-09-12 | Disposition: A | Discharge status: Home / Self Care | Payer: BLUE CROSS/BLUE SHIELD | Source: Ambulatory Visit | Attending: Radiation Oncology | Admitting: Radiation Oncology

## 2018-09-12 ENCOUNTER — Other Ambulatory Visit: Payer: Self-pay

## 2018-09-12 DIAGNOSIS — Z510 Encounter for antineoplastic radiation therapy: Secondary | ICD-10-CM | POA: Diagnosis not present

## 2018-09-12 NOTE — Progress Notes (Signed)
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  He also continues with his daily XRT.  He is tolerating his treatments well without significant side effects.  He has no neurologic complaints. He denies any recent fevers or illnesses.  He has a good appetite and denies weight loss.  He denies any dysphasia.  He has no chest pain, shortness of breath, cough, or hemoptysis.  He has occasional nausea, but denies any vomiting, constipation, or diarrhea.  He has no urinary complaints.  Patient otherwise feels well and offers no further specific complaints today.  REVIEW OF SYSTEMS:   Review of Systems  Constitutional: Positive for weight loss. Negative for fever and malaise/fatigue.  Respiratory: Negative.  Negative for cough and hemoptysis.   Cardiovascular: Negative.  Negative for chest pain and leg swelling.  Gastrointestinal: Positive for nausea. Negative for abdominal pain.  Genitourinary: Negative.  Negative for dysuria.  Musculoskeletal: Negative.  Negative for neck pain.  Skin: Negative.  Negative for rash.  Neurological: Negative.  Negative for dizziness, speech change, focal weakness, weakness and headaches.  Psychiatric/Behavioral: Negative.  The patient is not nervous/anxious.     As per HPI. Otherwise, a complete review of systems is negative.  PAST MEDICAL HISTORY: History reviewed. No pertinent past medical history.  PAST SURGICAL HISTORY: Past Surgical History:  Procedure Laterality Date  . APPENDECTOMY    . THROAT SURGERY Right     FAMILY HISTORY: Family History  Problem Relation Age of Onset  .  Aortic aneurysm Father     ADVANCED DIRECTIVES (Y/N):  N  HEALTH MAINTENANCE: Social History   Tobacco Use  . Smoking status: Never Smoker  . Smokeless tobacco: Current User    Types: Chew  Substance Use Topics  . Alcohol use: Yes    Alcohol/week: 0.0 standard drinks    Comment: occasional  . Drug use: No     Colonoscopy:  PAP:  Bone density:  Lipid panel:  No Known Allergies  Current Outpatient Medications  Medication Sig Dispense Refill  . acetaminophen-codeine (TYLENOL #3) 300-30 MG tablet Take 1 tablet by mouth every 4 (four) hours as needed for moderate pain. 30 tablet 0  . ALPRAZolam (XANAX) 0.25 MG tablet Take 1 tablet (0.25 mg total) by mouth daily as needed for anxiety. 15 tablet 0  . dexamethasone (DECADRON) 4 MG tablet Take 0.5 tablets (2 mg total) by mouth 2 (two) times daily. 30 tablet 0  . ondansetron (ZOFRAN) 8 MG tablet Take 1 tablet (8 mg total) by mouth 2 (two) times daily as needed. Start on the third day after chemotherapy. 30 tablet 1  . prochlorperazine (COMPAZINE) 10 MG tablet Take 1 tablet (10 mg total) by mouth every 6 (six) hours as needed (Nausea or vomiting). 60 tablet 1  . sucralfate (CARAFATE) 1 g tablet Take 1 tablet (1 g total) by mouth 3 (three) times daily before meals. Dissolve in warm water, swish and swallow 90 tablet 6  . Multiple Vitamins-Minerals (MULTIVITAMIN ADULT PO) Take 1 tablet by mouth 1 day or 1 dose.    . Omega-3 Fatty Acids (FISH OIL) 1000 MG CAPS Take 1 capsule by mouth daily.    Marland Kitchen  promethazine (PHENERGAN) 25 MG tablet Take 1 tablet (25 mg total) by mouth every 6 (six) hours as needed for nausea or vomiting. (Patient not taking: Reported on 09/13/2018) 30 tablet 0   No current facility-administered medications for this visit.     OBJECTIVE: Vitals:   09/13/18 0844  BP: (!) 137/92  Pulse: (!) 57  Temp: (!) 96.5 F (35.8 C)     Body mass index is 30.38 kg/m.    ECOG FS:0 - Asymptomatic  General: Well-developed,  well-nourished, no acute distress. Eyes: Pink conjunctiva, anicteric sclera. HEENT: Normocephalic, moist mucous membranes, clear oropharnyx.  Lymph node on left neck decreased in size. Lungs: Clear to auscultation bilaterally. Heart: Regular rate and rhythm. No rubs, murmurs, or gallops. Abdomen: Soft, nontender, nondistended. No organomegaly noted, normoactive bowel sounds. Musculoskeletal: No edema, cyanosis, or clubbing. Neuro: Alert, answering all questions appropriately. Cranial nerves grossly intact. Skin: No rashes or petechiae noted. Psych: Normal affect.  LAB RESULTS:  Lab Results  Component Value Date   NA 129 (L) 09/13/2018   K 4.4 09/13/2018   CL 93 (L) 09/13/2018   CO2 28 09/13/2018   GLUCOSE 97 09/13/2018   BUN 22 (H) 09/13/2018   CREATININE 0.85 09/13/2018   CALCIUM 9.1 09/13/2018   GFRNONAA >60 09/13/2018   GFRAA >60 09/13/2018    Lab Results  Component Value Date   WBC 6.0 09/13/2018   NEUTROABS 4.5 09/13/2018   HGB 13.3 09/13/2018   HCT 38.4 (L) 09/13/2018   MCV 85.3 09/13/2018   PLT 178 09/13/2018     STUDIES: No results found.  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  PET scan results from August 06, 2018 reviewed independently with no obvious hypermetabolic activity in positive lymph node or other area to suggest a primary.  Continue daily XRT completing on October 10, 2018.  Proceed with cycle 4 of weekly cisplatin.  Return to clinic in 1 week for lab and treatment only and then in 2 weeks for further evaluation and consideration of cycle 6.   2.  Nausea: Improved.  Continue Zofran and Compazine as needed. 3.  Anxiety: Continue Xanax as needed. 4.  Hyponatremia: Patient's sodium is 129 today.  Monitor.   Patient expressed understanding and was in agreement with this plan.  He also understands that He can call clinic at any time with any questions, concerns, or complaints.   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

## 2018-09-13 ENCOUNTER — Encounter: Payer: Self-pay | Admitting: Oncology

## 2018-09-13 ENCOUNTER — Inpatient Hospital Stay: Payer: BLUE CROSS/BLUE SHIELD

## 2018-09-13 ENCOUNTER — Inpatient Hospital Stay (HOSPITAL_BASED_OUTPATIENT_CLINIC_OR_DEPARTMENT_OTHER): Payer: BLUE CROSS/BLUE SHIELD | Admitting: Oncology

## 2018-09-13 ENCOUNTER — Other Ambulatory Visit: Payer: Self-pay

## 2018-09-13 ENCOUNTER — Ambulatory Visit
Admission: RE | Admit: 2018-09-13 | Discharge: 2018-09-13 | Disposition: A | Discharge status: Home / Self Care | Payer: BLUE CROSS/BLUE SHIELD | Source: Ambulatory Visit | Attending: Radiation Oncology | Admitting: Radiation Oncology

## 2018-09-13 VITALS — BP 137/92 | HR 57 | Temp 96.5°F | Ht 72.0 in | Wt 224.0 lb

## 2018-09-13 DIAGNOSIS — C779 Secondary and unspecified malignant neoplasm of lymph node, unspecified: Secondary | ICD-10-CM

## 2018-09-13 DIAGNOSIS — C770 Secondary and unspecified malignant neoplasm of lymph nodes of head, face and neck: Secondary | ICD-10-CM | POA: Diagnosis not present

## 2018-09-13 DIAGNOSIS — R110 Nausea: Secondary | ICD-10-CM

## 2018-09-13 DIAGNOSIS — Z510 Encounter for antineoplastic radiation therapy: Secondary | ICD-10-CM | POA: Diagnosis not present

## 2018-09-13 DIAGNOSIS — C801 Malignant (primary) neoplasm, unspecified: Secondary | ICD-10-CM | POA: Diagnosis not present

## 2018-09-13 DIAGNOSIS — F419 Anxiety disorder, unspecified: Secondary | ICD-10-CM | POA: Diagnosis not present

## 2018-09-13 DIAGNOSIS — E871 Hypo-osmolality and hyponatremia: Secondary | ICD-10-CM

## 2018-09-13 LAB — CBC WITH DIFFERENTIAL/PLATELET
Abs Immature Granulocytes: 0.03 10*3/uL (ref 0.00–0.07)
Basophils Absolute: 0 10*3/uL (ref 0.0–0.1)
Basophils Relative: 0 %
Eosinophils Absolute: 0.1 10*3/uL (ref 0.0–0.5)
Eosinophils Relative: 1 %
HCT: 38.4 % — ABNORMAL LOW (ref 39.0–52.0)
Hemoglobin: 13.3 g/dL (ref 13.0–17.0)
Immature Granulocytes: 1 %
Lymphocytes Relative: 14 %
Lymphs Abs: 0.8 10*3/uL (ref 0.7–4.0)
MCH: 29.6 pg (ref 26.0–34.0)
MCHC: 34.6 g/dL (ref 30.0–36.0)
MCV: 85.3 fL (ref 80.0–100.0)
Monocytes Absolute: 0.6 10*3/uL (ref 0.1–1.0)
Monocytes Relative: 10 %
Neutro Abs: 4.5 10*3/uL (ref 1.7–7.7)
Neutrophils Relative %: 74 %
Platelets: 178 10*3/uL (ref 150–400)
RBC: 4.5 MIL/uL (ref 4.22–5.81)
RDW: 12.4 % (ref 11.5–15.5)
WBC: 6 10*3/uL (ref 4.0–10.5)
nRBC: 0 % (ref 0.0–0.2)

## 2018-09-13 LAB — BASIC METABOLIC PANEL
Anion gap: 8 (ref 5–15)
BUN: 22 mg/dL — ABNORMAL HIGH (ref 6–20)
CO2: 28 mmol/L (ref 22–32)
Calcium: 9.1 mg/dL (ref 8.9–10.3)
Chloride: 93 mmol/L — ABNORMAL LOW (ref 98–111)
Creatinine, Ser: 0.85 mg/dL (ref 0.61–1.24)
GFR calc Af Amer: >60 mL/min (ref >60)
GFR calc non Af Amer: >60 mL/min (ref >60)
Glucose, Bld: 97 mg/dL (ref 70–99)
Potassium: 4.4 mmol/L (ref 3.5–5.1)
Sodium: 129 mmol/L — ABNORMAL LOW (ref 135–145)

## 2018-09-13 MED ORDER — PALONOSETRON HCL INJECTION 0.25 MG/5ML
0.2500 mg | Freq: Once | INTRAVENOUS | Status: AC
  Administered 2018-09-13: 12:00:00 0.25 mg via INTRAVENOUS
  Filled 2018-09-13: qty 5

## 2018-09-13 MED ORDER — SODIUM CHLORIDE 0.9 % IV SOLN
Freq: Once | INTRAVENOUS | Status: AC
  Administered 2018-09-13: 10:00:00 via INTRAVENOUS
  Filled 2018-09-13: qty 250

## 2018-09-13 MED ORDER — SODIUM CHLORIDE 0.9 % IV SOLN
Freq: Once | INTRAVENOUS | Status: AC
  Administered 2018-09-13: 12:00:00 via INTRAVENOUS
  Filled 2018-09-13: qty 5

## 2018-09-13 MED ORDER — POTASSIUM CHLORIDE 2 MEQ/ML IV SOLN
Freq: Once | INTRAVENOUS | Status: AC
  Administered 2018-09-13: 10:00:00 via INTRAVENOUS
  Filled 2018-09-13: qty 1000

## 2018-09-13 MED ORDER — SODIUM CHLORIDE 0.9 % IV SOLN
40.0000 mg/m2 | Freq: Once | INTRAVENOUS | Status: AC
  Administered 2018-09-13: 92 mg via INTRAVENOUS
  Filled 2018-09-13: qty 92

## 2018-09-13 NOTE — Progress Notes (Signed)
Patient stated that he had been doing well. Patient stated that his appetite is not the best but he is trying to eat. Patient had lost 9 lbs since 09/06/2018. Patient denied fever, chills, vomiting, diarrhea or constipation. Patient stated that he has nausea sometimes but Zofran and Compazine help.

## 2018-09-17 ENCOUNTER — Ambulatory Visit
Admission: RE | Admit: 2018-09-17 | Discharge: 2018-09-17 | Disposition: A | Discharge status: Home / Self Care | Payer: BLUE CROSS/BLUE SHIELD | Source: Ambulatory Visit | Attending: Radiation Oncology | Admitting: Radiation Oncology

## 2018-09-17 ENCOUNTER — Other Ambulatory Visit: Payer: Self-pay

## 2018-09-17 DIAGNOSIS — Z510 Encounter for antineoplastic radiation therapy: Secondary | ICD-10-CM | POA: Diagnosis not present

## 2018-09-18 ENCOUNTER — Other Ambulatory Visit: Payer: Self-pay

## 2018-09-18 ENCOUNTER — Ambulatory Visit
Admission: RE | Admit: 2018-09-18 | Discharge: 2018-09-18 | Disposition: A | Discharge status: Home / Self Care | Payer: BLUE CROSS/BLUE SHIELD | Source: Ambulatory Visit | Attending: Radiation Oncology | Admitting: Radiation Oncology

## 2018-09-18 DIAGNOSIS — Z510 Encounter for antineoplastic radiation therapy: Secondary | ICD-10-CM | POA: Diagnosis not present

## 2018-09-19 ENCOUNTER — Other Ambulatory Visit: Payer: Self-pay

## 2018-09-19 ENCOUNTER — Other Ambulatory Visit: Payer: Self-pay | Admitting: *Deleted

## 2018-09-19 ENCOUNTER — Ambulatory Visit
Admission: RE | Admit: 2018-09-19 | Discharge: 2018-09-19 | Disposition: A | Discharge status: Home / Self Care | Payer: BLUE CROSS/BLUE SHIELD | Source: Ambulatory Visit | Attending: Radiation Oncology | Admitting: Radiation Oncology

## 2018-09-19 DIAGNOSIS — Z510 Encounter for antineoplastic radiation therapy: Secondary | ICD-10-CM | POA: Diagnosis not present

## 2018-09-19 MED ORDER — ACETAMINOPHEN-CODEINE #3 300-30 MG PO TABS: 1.0000 | ORAL_TABLET | ORAL | 0 refills | Status: DC | PRN

## 2018-09-19 MED ORDER — ALPRAZOLAM 0.25 MG PO TABS: 0.2500 mg | ORAL_TABLET | Freq: Every day | ORAL | 0 refills | Status: DC | PRN

## 2018-09-20 ENCOUNTER — Other Ambulatory Visit: Payer: Self-pay

## 2018-09-20 ENCOUNTER — Inpatient Hospital Stay: Payer: BLUE CROSS/BLUE SHIELD

## 2018-09-20 ENCOUNTER — Other Ambulatory Visit: Payer: Self-pay | Admitting: Oncology

## 2018-09-20 ENCOUNTER — Ambulatory Visit
Admission: RE | Admit: 2018-09-20 | Discharge: 2018-09-20 | Disposition: A | Discharge status: Home / Self Care | Payer: BLUE CROSS/BLUE SHIELD | Source: Ambulatory Visit | Attending: Radiation Oncology | Admitting: Radiation Oncology

## 2018-09-20 DIAGNOSIS — C779 Secondary and unspecified malignant neoplasm of lymph node, unspecified: Secondary | ICD-10-CM

## 2018-09-20 DIAGNOSIS — Z510 Encounter for antineoplastic radiation therapy: Secondary | ICD-10-CM | POA: Diagnosis not present

## 2018-09-20 LAB — BASIC METABOLIC PANEL
Anion gap: 12 (ref 5–15)
BUN: 21 mg/dL — ABNORMAL HIGH (ref 6–20)
CO2: 25 mmol/L (ref 22–32)
Calcium: 9.1 mg/dL (ref 8.9–10.3)
Chloride: 97 mmol/L — ABNORMAL LOW (ref 98–111)
Creatinine, Ser: 0.96 mg/dL (ref 0.61–1.24)
GFR calc Af Amer: >60 mL/min (ref >60)
GFR calc non Af Amer: >60 mL/min (ref >60)
Glucose, Bld: 107 mg/dL — ABNORMAL HIGH (ref 70–99)
Potassium: 4.7 mmol/L (ref 3.5–5.1)
Sodium: 134 mmol/L — ABNORMAL LOW (ref 135–145)

## 2018-09-20 LAB — CBC WITH DIFFERENTIAL/PLATELET
Abs Immature Granulocytes: 0.01 10*3/uL (ref 0.00–0.07)
Basophils Absolute: 0 10*3/uL (ref 0.0–0.1)
Basophils Relative: 0 %
Eosinophils Absolute: 0 10*3/uL (ref 0.0–0.5)
Eosinophils Relative: 1 %
HCT: 36.3 % — ABNORMAL LOW (ref 39.0–52.0)
Hemoglobin: 12.2 g/dL — ABNORMAL LOW (ref 13.0–17.0)
Immature Granulocytes: 0 %
Lymphocytes Relative: 11 %
Lymphs Abs: 0.5 10*3/uL — ABNORMAL LOW (ref 0.7–4.0)
MCH: 29.2 pg (ref 26.0–34.0)
MCHC: 33.6 g/dL (ref 30.0–36.0)
MCV: 86.8 fL (ref 80.0–100.0)
Monocytes Absolute: 0.3 10*3/uL (ref 0.1–1.0)
Monocytes Relative: 8 %
Neutro Abs: 3.3 10*3/uL (ref 1.7–7.7)
Neutrophils Relative %: 80 %
Platelets: 75 10*3/uL — ABNORMAL LOW (ref 150–400)
RBC: 4.18 MIL/uL — ABNORMAL LOW (ref 4.22–5.81)
RDW: 12.9 % (ref 11.5–15.5)
WBC: 4.1 10*3/uL (ref 4.0–10.5)
nRBC: 0 % (ref 0.0–0.2)

## 2018-09-20 MED ORDER — POTASSIUM CHLORIDE 2 MEQ/ML IV SOLN
Freq: Once | INTRAVENOUS | Status: AC
  Administered 2018-09-20: 10:00:00 via INTRAVENOUS
  Filled 2018-09-20: qty 1000

## 2018-09-20 MED ORDER — SODIUM CHLORIDE 0.9 % IV SOLN
Freq: Once | INTRAVENOUS | Status: AC
  Administered 2018-09-20: 12:00:00 via INTRAVENOUS
  Filled 2018-09-20: qty 5

## 2018-09-20 MED ORDER — SODIUM CHLORIDE 0.9 % IV SOLN
Freq: Once | INTRAVENOUS | Status: AC
  Administered 2018-09-20: 09:00:00 via INTRAVENOUS
  Filled 2018-09-20: qty 250

## 2018-09-20 MED ORDER — SODIUM CHLORIDE 0.9 % IV SOLN
40.0000 mg/m2 | Freq: Once | INTRAVENOUS | Status: AC
  Administered 2018-09-20: 13:00:00 92 mg via INTRAVENOUS
  Filled 2018-09-20: qty 92

## 2018-09-20 MED ORDER — PALONOSETRON HCL INJECTION 0.25 MG/5ML
0.2500 mg | Freq: Once | INTRAVENOUS | Status: AC
  Administered 2018-09-20: 12:00:00 0.25 mg via INTRAVENOUS
  Filled 2018-09-20: qty 5

## 2018-09-20 NOTE — Progress Notes (Signed)
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  He is having significant dysphasia, but otherwise is tolerating his treatments well.  He has no neurologic complaints. He denies any recent fevers or illnesses.  He has a poor appetite.  He has no chest pain, shortness of breath, cough, or hemoptysis.  He has occasional nausea, but denies any vomiting, constipation, or diarrhea.  He has no urinary complaints.  Patient offers no further specific complaints today.  REVIEW OF SYSTEMS:   Review of Systems  Constitutional: Positive for malaise/fatigue and weight loss. Negative for fever.  HENT: Positive for sore throat.   Respiratory: Negative.  Negative for cough and hemoptysis.   Cardiovascular: Negative.  Negative for chest pain and leg swelling.  Gastrointestinal: Positive for nausea. Negative for abdominal pain.  Genitourinary: Negative.  Negative for dysuria.  Musculoskeletal: Negative.  Negative for neck pain.  Skin: Negative.  Negative for rash.  Neurological: Positive for weakness. Negative for dizziness, speech change, focal weakness and headaches.  Endo/Heme/Allergies: Does not bruise/bleed easily.  Psychiatric/Behavioral: Negative.  The patient is not nervous/anxious.     As per HPI. Otherwise, a complete review of systems is negative.  PAST MEDICAL HISTORY: History reviewed. No pertinent past medical history.  PAST SURGICAL HISTORY: Past Surgical History:  Procedure Laterality Date  . APPENDECTOMY    . THROAT SURGERY Right     FAMILY HISTORY: Family History  Problem Relation Age of Onset  . Aortic  aneurysm Father     ADVANCED DIRECTIVES (Y/N):  N  HEALTH MAINTENANCE: Social History   Tobacco Use  . Smoking status: Never Smoker  . Smokeless tobacco: Current User    Types: Chew  Substance Use Topics  . Alcohol use: Yes    Alcohol/week: 0.0 standard drinks    Comment: occasional  . Drug use: No     Colonoscopy:  PAP:  Bone density:  Lipid panel:  No Known Allergies  Current Outpatient Medications  Medication Sig Dispense Refill  . acetaminophen-codeine (TYLENOL #3) 300-30 MG tablet Take 1 tablet by mouth every 4 (four) hours as needed for moderate pain. 30 tablet 0  . ALPRAZolam (XANAX) 0.25 MG tablet Take 1 tablet (0.25 mg total) by mouth daily as needed for anxiety. 15 tablet 0  . dexamethasone (DECADRON) 4 MG tablet Take 1 tablet (4 mg total) by mouth 2 (two) times daily. 50 tablet 0  . ondansetron (ZOFRAN) 8 MG tablet Take 1 tablet (8 mg total) by mouth 2 (two) times daily as needed. Start on the third day after chemotherapy. 30 tablet 1  . prochlorperazine (COMPAZINE) 10 MG tablet Take 1 tablet (10 mg total) by mouth every 6 (six) hours as needed (Nausea or vomiting). 60 tablet 1  . promethazine (PHENERGAN) 25 MG tablet Take 1 tablet (25 mg total) by mouth every 6 (six) hours as needed for nausea or vomiting. 30 tablet 0  . sucralfate (CARAFATE) 1 g tablet Take 1 tablet (1 g total) by mouth 3 (three) times daily before meals. Dissolve in warm water, swish and swallow 90 tablet 6  . Multiple Vitamins-Minerals (MULTIVITAMIN ADULT PO) Take 1 tablet by mouth 1 day  or 1 dose.    . Omega-3 Fatty Acids (FISH OIL) 1000 MG CAPS Take 1 capsule by mouth daily.     No current facility-administered medications for this visit.     OBJECTIVE: Vitals:   09/27/18 0845  BP: 128/85  Pulse: 64  Resp: 18  Temp: (!) 95.4 F (35.2 C)     Body mass index is 27.53 kg/m.    ECOG FS:0 - Asymptomatic  General: Well-developed, well-nourished, no acute distress. Eyes: Pink  conjunctiva, anicteric sclera. HEENT: Normocephalic, moist mucous membranes, clear oropharnyx.  Lymph node on left neck significantly decreased in size. Lungs: Clear to auscultation bilaterally. Heart: Regular rate and rhythm. No rubs, murmurs, or gallops. Abdomen: Soft, nontender, nondistended. No organomegaly noted, normoactive bowel sounds. Musculoskeletal: No edema, cyanosis, or clubbing. Neuro: Alert, answering all questions appropriately. Cranial nerves grossly intact. Skin: No rashes or petechiae noted. Psych: Normal affect.  LAB RESULTS:  Lab Results  Component Value Date   NA 131 (L) 09/27/2018   K 4.5 09/27/2018   CL 95 (L) 09/27/2018   CO2 27 09/27/2018   GLUCOSE 113 (H) 09/27/2018   BUN 27 (H) 09/27/2018   CREATININE 1.04 09/27/2018   CALCIUM 9.2 09/27/2018   GFRNONAA >60 09/27/2018   GFRAA >60 09/27/2018    Lab Results  Component Value Date   WBC 2.4 (L) 09/27/2018   NEUTROABS 1.8 09/27/2018   HGB 12.3 (L) 09/27/2018   HCT 35.0 (L) 09/27/2018   MCV 85.6 09/27/2018   PLT 64 (L) 09/27/2018     STUDIES: No results found.  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  PET scan results from August 06, 2018 reviewed independently with no obvious hypermetabolic activity in positive lymph node or other area to suggest a primary.  Continue daily XRT completing on October 10, 2018.  Delay cycle 6 of weekly cisplatin secondary to thrombocytopenia.  Return to clinic in 1 week for further evaluation and reconsideration of treatment. 2.  Thrombocytopenia: Secondary to chemotherapy, delay treatment as above. 3.  Leukopenia: Secondary to chemotherapy.  Monitor. 4.  Dysphasia: Continue Carafate and patient was given a prescription for Magic mouthwash today. 5.  Nausea: Improved.  Continue Zofran and  Compazine as needed. 6.  Anxiety: Continue Xanax as needed. 7.  Hyponatremia: Sodium improved to 131.  Monitor.   Patient expressed understanding and was in agreement with this plan. He also understands that He can call clinic at any time with any questions, concerns, or complaints.   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

## 2018-09-23 ENCOUNTER — Other Ambulatory Visit: Payer: Self-pay

## 2018-09-23 ENCOUNTER — Ambulatory Visit
Admission: RE | Admit: 2018-09-23 | Discharge: 2018-09-23 | Disposition: A | Discharge status: Home / Self Care | Payer: BC Managed Care – PPO | Source: Ambulatory Visit | Attending: Radiation Oncology | Admitting: Radiation Oncology

## 2018-09-23 ENCOUNTER — Telehealth: Payer: Self-pay | Admitting: *Deleted

## 2018-09-23 DIAGNOSIS — Z510 Encounter for antineoplastic radiation therapy: Secondary | ICD-10-CM | POA: Insufficient documentation

## 2018-09-23 DIAGNOSIS — C801 Malignant (primary) neoplasm, unspecified: Secondary | ICD-10-CM | POA: Insufficient documentation

## 2018-09-23 DIAGNOSIS — C779 Secondary and unspecified malignant neoplasm of lymph node, unspecified: Secondary | ICD-10-CM | POA: Insufficient documentation

## 2018-09-23 NOTE — Telephone Encounter (Signed)
radiation therapy appointment moved to 830 by Tiffany and patient agrees to come in at this time then come up to see NP and get IV fluids

## 2018-09-23 NOTE — Telephone Encounter (Signed)
[facility] can see him tomorrow after his XRT with IVF.  Continue carafate and MM.

## 2018-09-23 NOTE — Telephone Encounter (Signed)
Dustin Hall called reporting that patient is not eating or drinking and that he is not sleeping due to his throat. The mouthwash given is not helping at all. Please advise

## 2018-09-23 NOTE — Telephone Encounter (Signed)
I spoke with Dustin Hall and infusion can accommodate and give IVF tomorrow afternoon.

## 2018-09-24 ENCOUNTER — Other Ambulatory Visit: Payer: Self-pay

## 2018-09-24 ENCOUNTER — Ambulatory Visit
Admission: RE | Admit: 2018-09-24 | Discharge: 2018-09-24 | Disposition: A | Discharge status: Home / Self Care | Payer: BC Managed Care – PPO | Source: Ambulatory Visit | Attending: Radiation Oncology | Admitting: Radiation Oncology

## 2018-09-24 ENCOUNTER — Ambulatory Visit: Payer: BLUE CROSS/BLUE SHIELD

## 2018-09-24 ENCOUNTER — Encounter: Payer: BLUE CROSS/BLUE SHIELD | Admitting: Nurse Practitioner

## 2018-09-24 DIAGNOSIS — Z510 Encounter for antineoplastic radiation therapy: Secondary | ICD-10-CM | POA: Diagnosis not present

## 2018-09-25 ENCOUNTER — Other Ambulatory Visit: Payer: Self-pay

## 2018-09-25 ENCOUNTER — Ambulatory Visit
Admission: RE | Admit: 2018-09-25 | Discharge: 2018-09-25 | Disposition: A | Discharge status: Home / Self Care | Payer: BC Managed Care – PPO | Source: Ambulatory Visit | Attending: Radiation Oncology | Admitting: Radiation Oncology

## 2018-09-25 DIAGNOSIS — Z510 Encounter for antineoplastic radiation therapy: Secondary | ICD-10-CM | POA: Diagnosis not present

## 2018-09-26 ENCOUNTER — Other Ambulatory Visit: Payer: Self-pay | Admitting: *Deleted

## 2018-09-26 ENCOUNTER — Other Ambulatory Visit: Payer: Self-pay

## 2018-09-26 ENCOUNTER — Ambulatory Visit: Payer: BC Managed Care – PPO

## 2018-09-26 DIAGNOSIS — C779 Secondary and unspecified malignant neoplasm of lymph node, unspecified: Secondary | ICD-10-CM

## 2018-09-26 MED ORDER — PROCHLORPERAZINE MALEATE 10 MG PO TABS: 10.0000 mg | ORAL_TABLET | Freq: Four times a day (QID) | ORAL | 1 refills | Status: DC | PRN

## 2018-09-26 MED ORDER — DEXAMETHASONE 4 MG PO TABS: 4.0000 mg | ORAL_TABLET | Freq: Two times a day (BID) | ORAL | 0 refills | Status: DC

## 2018-09-26 MED ORDER — ACETAMINOPHEN-CODEINE #3 300-30 MG PO TABS: 1.0000 | ORAL_TABLET | ORAL | 0 refills | Status: DC | PRN

## 2018-09-26 MED ORDER — PROMETHAZINE HCL 25 MG PO TABS: 25.0000 mg | ORAL_TABLET | Freq: Four times a day (QID) | ORAL | 0 refills | Status: DC | PRN

## 2018-09-27 ENCOUNTER — Encounter: Payer: Self-pay | Admitting: Oncology

## 2018-09-27 ENCOUNTER — Inpatient Hospital Stay: Payer: BC Managed Care – PPO | Attending: Oncology

## 2018-09-27 ENCOUNTER — Inpatient Hospital Stay: Payer: BC Managed Care – PPO

## 2018-09-27 ENCOUNTER — Ambulatory Visit
Admission: RE | Admit: 2018-09-27 | Discharge: 2018-09-27 | Disposition: A | Discharge status: Home / Self Care | Payer: BC Managed Care – PPO | Source: Ambulatory Visit | Attending: Radiation Oncology | Admitting: Radiation Oncology

## 2018-09-27 ENCOUNTER — Telehealth: Payer: Self-pay | Admitting: *Deleted

## 2018-09-27 ENCOUNTER — Other Ambulatory Visit: Payer: Self-pay

## 2018-09-27 ENCOUNTER — Inpatient Hospital Stay (HOSPITAL_BASED_OUTPATIENT_CLINIC_OR_DEPARTMENT_OTHER): Payer: BC Managed Care – PPO | Admitting: Oncology

## 2018-09-27 VITALS — BP 128/85 | HR 64 | Temp 95.4°F | Resp 18 | Wt 203.0 lb

## 2018-09-27 DIAGNOSIS — T451X5A Adverse effect of antineoplastic and immunosuppressive drugs, initial encounter: Secondary | ICD-10-CM | POA: Insufficient documentation

## 2018-09-27 DIAGNOSIS — Z7289 Other problems related to lifestyle: Secondary | ICD-10-CM | POA: Diagnosis not present

## 2018-09-27 DIAGNOSIS — E871 Hypo-osmolality and hyponatremia: Secondary | ICD-10-CM | POA: Diagnosis not present

## 2018-09-27 DIAGNOSIS — C760 Malignant neoplasm of head, face and neck: Secondary | ICD-10-CM | POA: Insufficient documentation

## 2018-09-27 DIAGNOSIS — D6959 Other secondary thrombocytopenia: Secondary | ICD-10-CM | POA: Insufficient documentation

## 2018-09-27 DIAGNOSIS — Z510 Encounter for antineoplastic radiation therapy: Secondary | ICD-10-CM | POA: Diagnosis not present

## 2018-09-27 DIAGNOSIS — C779 Secondary and unspecified malignant neoplasm of lymph node, unspecified: Secondary | ICD-10-CM

## 2018-09-27 DIAGNOSIS — E860 Dehydration: Secondary | ICD-10-CM | POA: Diagnosis not present

## 2018-09-27 DIAGNOSIS — E46 Unspecified protein-calorie malnutrition: Secondary | ICD-10-CM

## 2018-09-27 DIAGNOSIS — K209 Esophagitis, unspecified: Secondary | ICD-10-CM | POA: Diagnosis not present

## 2018-09-27 DIAGNOSIS — C801 Malignant (primary) neoplasm, unspecified: Secondary | ICD-10-CM

## 2018-09-27 DIAGNOSIS — D701 Agranulocytosis secondary to cancer chemotherapy: Secondary | ICD-10-CM

## 2018-09-27 DIAGNOSIS — Z79899 Other long term (current) drug therapy: Secondary | ICD-10-CM | POA: Insufficient documentation

## 2018-09-27 DIAGNOSIS — R1310 Dysphagia, unspecified: Secondary | ICD-10-CM | POA: Insufficient documentation

## 2018-09-27 DIAGNOSIS — R4702 Dysphasia: Secondary | ICD-10-CM | POA: Insufficient documentation

## 2018-09-27 DIAGNOSIS — F419 Anxiety disorder, unspecified: Secondary | ICD-10-CM | POA: Diagnosis not present

## 2018-09-27 DIAGNOSIS — R112 Nausea with vomiting, unspecified: Secondary | ICD-10-CM | POA: Insufficient documentation

## 2018-09-27 DIAGNOSIS — J029 Acute pharyngitis, unspecified: Secondary | ICD-10-CM | POA: Insufficient documentation

## 2018-09-27 LAB — BASIC METABOLIC PANEL
Anion gap: 9 (ref 5–15)
BUN: 27 mg/dL — ABNORMAL HIGH (ref 6–20)
CO2: 27 mmol/L (ref 22–32)
Calcium: 9.2 mg/dL (ref 8.9–10.3)
Chloride: 95 mmol/L — ABNORMAL LOW (ref 98–111)
Creatinine, Ser: 1.04 mg/dL (ref 0.61–1.24)
GFR calc Af Amer: >60 mL/min (ref >60)
GFR calc non Af Amer: >60 mL/min (ref >60)
Glucose, Bld: 113 mg/dL — ABNORMAL HIGH (ref 70–99)
Potassium: 4.5 mmol/L (ref 3.5–5.1)
Sodium: 131 mmol/L — ABNORMAL LOW (ref 135–145)

## 2018-09-27 LAB — CBC WITH DIFFERENTIAL/PLATELET
Abs Immature Granulocytes: 0.02 10*3/uL (ref 0.00–0.07)
Basophils Absolute: 0 10*3/uL (ref 0.0–0.1)
Basophils Relative: 0 %
Eosinophils Absolute: 0 10*3/uL (ref 0.0–0.5)
Eosinophils Relative: 1 %
HCT: 35 % — ABNORMAL LOW (ref 39.0–52.0)
Hemoglobin: 12.3 g/dL — ABNORMAL LOW (ref 13.0–17.0)
Immature Granulocytes: 1 %
Lymphocytes Relative: 17 %
Lymphs Abs: 0.4 10*3/uL — ABNORMAL LOW (ref 0.7–4.0)
MCH: 30.1 pg (ref 26.0–34.0)
MCHC: 35.1 g/dL (ref 30.0–36.0)
MCV: 85.6 fL (ref 80.0–100.0)
Monocytes Absolute: 0.2 10*3/uL (ref 0.1–1.0)
Monocytes Relative: 8 %
Neutro Abs: 1.8 10*3/uL (ref 1.7–7.7)
Neutrophils Relative %: 73 %
Platelets: 64 10*3/uL — ABNORMAL LOW (ref 150–400)
RBC: 4.09 MIL/uL — ABNORMAL LOW (ref 4.22–5.81)
RDW: 12.7 % (ref 11.5–15.5)
WBC: 2.4 10*3/uL — ABNORMAL LOW (ref 4.0–10.5)
nRBC: 0 % (ref 0.0–0.2)

## 2018-09-27 NOTE — Telephone Encounter (Signed)
Dustin Hall- can you call this in?

## 2018-09-27 NOTE — Progress Notes (Signed)
Paint here for follow up. Complains of throat pain 8/10 making it hard to swallow, it is worse in the mornings. This has been going on for about 2 weeks

## 2018-09-27 NOTE — Telephone Encounter (Signed)
Patient needs a prescription for medicated Mouth wash sent to [facility]

## 2018-09-29 NOTE — Progress Notes (Signed)
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  Patient's XRT has also been temporarily discontinued.  He continues to have significant dysphasia, poor appetite, and weight loss but feels this has improved over the last 1 to 2 days. He has no neurologic complaints. He denies any recent fevers or illnesses. He has no chest pain, shortness of breath, cough, or hemoptysis.  He denies any nausea, vomiting, constipation, or diarrhea.  He has no urinary complaints.  Patient feels generally terrible, but offers no further specific complaints today.  REVIEW OF SYSTEMS:   Review of Systems  Constitutional: Positive for malaise/fatigue and weight loss. Negative for fever.  HENT: Positive for sore throat.   Respiratory: Negative.  Negative for cough and hemoptysis.   Cardiovascular: Negative.  Negative for chest pain and leg swelling.  Gastrointestinal: Negative.  Negative for abdominal pain and nausea.  Genitourinary: Negative.  Negative for dysuria.  Musculoskeletal: Negative.  Negative for neck pain.  Skin: Negative.  Negative for rash.  Neurological: Positive for weakness. Negative for dizziness, speech change, focal weakness and headaches.  Endo/Heme/Allergies: Does not bruise/bleed easily.  Psychiatric/Behavioral: Negative.  The patient is not nervous/anxious.     As per HPI. Otherwise, a complete review of systems is negative.  PAST MEDICAL HISTORY: History reviewed. No pertinent past medical history.  PAST SURGICAL HISTORY: Past Surgical History:  Procedure Laterality Date  . APPENDECTOMY    . THROAT SURGERY Right      FAMILY HISTORY: Family History  Problem Relation Age of Onset  . Aortic aneurysm Father     ADVANCED DIRECTIVES (Y/N):  N  HEALTH MAINTENANCE: Social History   Tobacco Use  . Smoking status: Never Smoker  . Smokeless tobacco: Current User    Types: Chew  Substance Use Topics  . Alcohol use: Yes    Alcohol/week: 0.0 standard drinks    Comment: occasional  . Drug use: No     Colonoscopy:  PAP:  Bone density:  Lipid panel:  No Known Allergies  Current Outpatient Medications  Medication Sig Dispense Refill  . dexamethasone (DECADRON) 4 MG tablet Take 1 tablet (4 mg total) by mouth 2 (two) times daily. 50 tablet 0  . prochlorperazine (COMPAZINE) 10 MG tablet Take 1 tablet (10 mg total) by mouth every 6 (six) hours as needed (Nausea or vomiting). 60 tablet 1  . promethazine (PHENERGAN) 25 MG tablet Take 1 tablet (25 mg total) by mouth every 6 (six) hours as needed for nausea or vomiting. 30 tablet 0  . silver sulfADIAZINE (SILVADENE) 1 % cream Apply 1 application topically 2 (two) times daily. 50 g 2  . sucralfate (CARAFATE) 1 g tablet Take 1 tablet (1 g total) by mouth 3 (three) times daily before meals. Dissolve in warm water, swish and swallow 90 tablet 6  . acetaminophen-codeine 120-12 MG/5ML solution Take 12.5 mLs by mouth every 4 (four) hours as needed for moderate pain. 480 mL 0  . ALPRAZolam (XANAX) 0.25 MG tablet Take 1 tablet (0.25 mg total) by mouth daily as needed for anxiety. 15 tablet 0  . ondansetron (ZOFRAN) 8 MG tablet Take 1 tablet (8 mg total) by mouth 2 (  two) times daily as needed. Start on the third day after chemotherapy. 30 tablet 1   No current facility-administered medications for this visit.     OBJECTIVE: Vitals:   10/04/18 0856  BP: 107/78  Pulse: 82  Temp: (!) 97.2 F (36.2 C)     Body mass index is 25.94 kg/m.    ECOG FS:0 - Asymptomatic  General: Well-developed, well-nourished, no acute distress. Eyes: Pink conjunctiva, anicteric sclera.  HEENT: Normocephalic, moist mucous membranes, clear oropharnyx.  Lymph node on left neck nearly resolved. Lungs: Clear to auscultation bilaterally. Heart: Regular rate and rhythm. No rubs, murmurs, or gallops. Abdomen: Soft, nontender, nondistended. No organomegaly noted, normoactive bowel sounds. Musculoskeletal: No edema, cyanosis, or clubbing. Neuro: Alert, answering all questions appropriately. Cranial nerves grossly intact. Skin: No rashes or petechiae noted. Psych: Normal affect.  LAB RESULTS:  Lab Results  Component Value Date   NA 141 10/04/2018   K 5.4 (H) 10/04/2018   CL 104 10/04/2018   CO2 26 10/04/2018   GLUCOSE 175 (H) 10/04/2018   BUN 42 (H) 10/04/2018   CREATININE 1.17 10/04/2018   CALCIUM 9.6 10/04/2018   PROT 8.3 (H) 09/30/2018   ALBUMIN 4.4 09/30/2018   AST 21 09/30/2018   ALT 33 09/30/2018   ALKPHOS 91 09/30/2018   BILITOT 1.1 09/30/2018   GFRNONAA >60 10/04/2018   GFRAA >60 10/04/2018    Lab Results  Component Value Date   WBC 0.8 (LL) 10/04/2018   NEUTROABS 0.5 (L) 10/04/2018   HGB 12.4 (L) 10/04/2018   HCT 36.1 (L) 10/04/2018   MCV 87.0 10/04/2018   PLT 69 (L) 10/04/2018     STUDIES: No results found.  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  PET scan results from August 06, 2018 reviewed independently with no obvious hypermetabolic activity in positive lymph node or other area to suggest a primary.  Continue daily XRT completing on October 16, 2018.  Given patient's persistent pancytopenia, weight loss, and dysphasia will discontinue chemotherapy at this time.  Return to clinic in 1 week with repeat laboratory work and further evaluation.   2.  Thrombocytopenia: Secondary to chemotherapy, discontinue treatment as above. 3.  Neutropenia: Secondary to chemotherapy,  discontinue treatment as above. 4.  Dysphasia: Secondary to XRT.  Continue Carafate and Magic mouthwash as prescribed. 5.  Weight loss: We discussed at length with patient and his wife regarding a possible feeding tube.  Patient declined at this time, but states he might agree to one at a later date if his weight continues to trend down.  6.  Anxiety: Continue Xanax as needed. 7.  Hyponatremia: Resolved.  Patient expressed understanding and was in agreement with this plan. He also understands that He can call clinic at any time with any questions, concerns, or complaints.   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

## 2018-09-30 ENCOUNTER — Other Ambulatory Visit: Payer: Self-pay

## 2018-09-30 ENCOUNTER — Other Ambulatory Visit: Payer: Self-pay | Admitting: *Deleted

## 2018-09-30 ENCOUNTER — Inpatient Hospital Stay (HOSPITAL_BASED_OUTPATIENT_CLINIC_OR_DEPARTMENT_OTHER): Payer: BC Managed Care – PPO | Admitting: Oncology

## 2018-09-30 ENCOUNTER — Inpatient Hospital Stay: Payer: BC Managed Care – PPO

## 2018-09-30 ENCOUNTER — Encounter: Payer: Self-pay | Admitting: Oncology

## 2018-09-30 ENCOUNTER — Ambulatory Visit
Admission: RE | Admit: 2018-09-30 | Discharge: 2018-09-30 | Disposition: A | Discharge status: Home / Self Care | Payer: BC Managed Care – PPO | Source: Ambulatory Visit | Attending: Radiation Oncology | Admitting: Radiation Oncology

## 2018-09-30 VITALS — BP 126/90 | HR 89 | Temp 96.9°F | Resp 18 | Ht 72.0 in | Wt 195.5 lb

## 2018-09-30 DIAGNOSIS — C760 Malignant neoplasm of head, face and neck: Secondary | ICD-10-CM

## 2018-09-30 DIAGNOSIS — E860 Dehydration: Secondary | ICD-10-CM

## 2018-09-30 DIAGNOSIS — K209 Esophagitis, unspecified: Secondary | ICD-10-CM

## 2018-09-30 DIAGNOSIS — D701 Agranulocytosis secondary to cancer chemotherapy: Secondary | ICD-10-CM | POA: Diagnosis not present

## 2018-09-30 DIAGNOSIS — Z510 Encounter for antineoplastic radiation therapy: Secondary | ICD-10-CM | POA: Diagnosis not present

## 2018-09-30 DIAGNOSIS — E871 Hypo-osmolality and hyponatremia: Secondary | ICD-10-CM

## 2018-09-30 DIAGNOSIS — R112 Nausea with vomiting, unspecified: Secondary | ICD-10-CM

## 2018-09-30 DIAGNOSIS — R1310 Dysphagia, unspecified: Secondary | ICD-10-CM

## 2018-09-30 DIAGNOSIS — Z7289 Other problems related to lifestyle: Secondary | ICD-10-CM

## 2018-09-30 DIAGNOSIS — E46 Unspecified protein-calorie malnutrition: Secondary | ICD-10-CM

## 2018-09-30 DIAGNOSIS — C779 Secondary and unspecified malignant neoplasm of lymph node, unspecified: Secondary | ICD-10-CM

## 2018-09-30 DIAGNOSIS — R4702 Dysphasia: Secondary | ICD-10-CM

## 2018-09-30 DIAGNOSIS — J029 Acute pharyngitis, unspecified: Secondary | ICD-10-CM

## 2018-09-30 DIAGNOSIS — D6959 Other secondary thrombocytopenia: Secondary | ICD-10-CM | POA: Diagnosis not present

## 2018-09-30 DIAGNOSIS — T451X5A Adverse effect of antineoplastic and immunosuppressive drugs, initial encounter: Secondary | ICD-10-CM

## 2018-09-30 DIAGNOSIS — Z79899 Other long term (current) drug therapy: Secondary | ICD-10-CM

## 2018-09-30 DIAGNOSIS — F419 Anxiety disorder, unspecified: Secondary | ICD-10-CM

## 2018-09-30 DIAGNOSIS — R110 Nausea: Secondary | ICD-10-CM

## 2018-09-30 LAB — CBC WITH DIFFERENTIAL/PLATELET
Abs Immature Granulocytes: 0.01 10*3/uL (ref 0.00–0.07)
Basophils Absolute: 0 10*3/uL (ref 0.0–0.1)
Basophils Relative: 0 %
Eosinophils Absolute: 0 10*3/uL (ref 0.0–0.5)
Eosinophils Relative: 1 %
HCT: 37 % — ABNORMAL LOW (ref 39.0–52.0)
Hemoglobin: 12.7 g/dL — ABNORMAL LOW (ref 13.0–17.0)
Immature Granulocytes: 1 %
Lymphocytes Relative: 16 %
Lymphs Abs: 0.3 10*3/uL — ABNORMAL LOW (ref 0.7–4.0)
MCH: 29.8 pg (ref 26.0–34.0)
MCHC: 34.3 g/dL (ref 30.0–36.0)
MCV: 86.9 fL (ref 80.0–100.0)
Monocytes Absolute: 0.1 10*3/uL (ref 0.1–1.0)
Monocytes Relative: 8 %
Neutro Abs: 1.3 10*3/uL — ABNORMAL LOW (ref 1.7–7.7)
Neutrophils Relative %: 74 %
Platelets: 62 10*3/uL — ABNORMAL LOW (ref 150–400)
RBC: 4.26 MIL/uL (ref 4.22–5.81)
RDW: 13 % (ref 11.5–15.5)
WBC: 1.8 10*3/uL — ABNORMAL LOW (ref 4.0–10.5)
nRBC: 0 % (ref 0.0–0.2)

## 2018-09-30 LAB — COMPREHENSIVE METABOLIC PANEL
ALT: 33 U/L (ref 0–44)
AST: 21 U/L (ref 15–41)
Albumin: 4.4 g/dL (ref 3.5–5.0)
Alkaline Phosphatase: 91 U/L (ref 38–126)
Anion gap: 15 (ref 5–15)
BUN: 37 mg/dL — ABNORMAL HIGH (ref 6–20)
CO2: 23 mmol/L (ref 22–32)
Calcium: 9.3 mg/dL (ref 8.9–10.3)
Chloride: 98 mmol/L (ref 98–111)
Creatinine, Ser: 1.24 mg/dL (ref 0.61–1.24)
GFR calc Af Amer: >60 mL/min (ref >60)
GFR calc non Af Amer: >60 mL/min (ref >60)
Glucose, Bld: 124 mg/dL — ABNORMAL HIGH (ref 70–99)
Potassium: 4.6 mmol/L (ref 3.5–5.1)
Sodium: 136 mmol/L (ref 135–145)
Total Bilirubin: 1.1 mg/dL (ref 0.3–1.2)
Total Protein: 8.3 g/dL — ABNORMAL HIGH (ref 6.5–8.1)

## 2018-09-30 LAB — MAGNESIUM: Magnesium: 2.3 mg/dL (ref 1.7–2.4)

## 2018-09-30 MED ORDER — ONDANSETRON HCL 4 MG/2ML IJ SOLN
4.0000 mg | Freq: Once | INTRAMUSCULAR | Status: AC
  Administered 2018-09-30: 4 mg via INTRAVENOUS
  Filled 2018-09-30: qty 2

## 2018-09-30 MED ORDER — SODIUM CHLORIDE 0.9 % IV SOLN
INTRAVENOUS | Status: DC
  Administered 2018-09-30: 13:00:00 via INTRAVENOUS
  Filled 2018-09-30 (×2): qty 250

## 2018-09-30 MED ORDER — SILVER SULFADIAZINE 1 % EX CREA: 1.0000 "application " | TOPICAL_CREAM | Freq: Two times a day (BID) | CUTANEOUS | 2 refills | Status: DC

## 2018-09-30 MED ORDER — DEXAMETHASONE SODIUM PHOSPHATE 10 MG/ML IJ SOLN
10.0000 mg | Freq: Once | INTRAMUSCULAR | Status: AC
  Administered 2018-09-30: 14:00:00 10 mg via INTRAVENOUS
  Filled 2018-09-30: qty 1

## 2018-09-30 NOTE — Patient Instructions (Signed)
It was nice to meet you today.  I am so sorry that you are not feeling well.  I have called in a prescription for doxepin 0.5 oral rinse.  This medication is to help with the pain in your mouth.  You should swish and spit.  You can do this 3-6 times per day  The other medication to help with your throat pain and ensure healing is called healios.  I found it on a website called [employer] for 54.99.  This does wonders for your throat and healing.  I am attempting to get some samples sent to the clinic but they will not be here for another week or so.  I will surely get those to you as soon as I get them and I potentially could get funds to help reimburse you but I think this would be a great investment.   Esophagitis  Esophagitis is inflammation of the esophagus. The esophagus is the tube that carries food and liquids from your mouth to your stomach. Esophagitis can cause soreness or pain in the esophagus. This condition can make it difficult and painful to swallow. What are the causes? Most causes of esophagitis are not serious. Common causes of this condition include:  Gastroesophageal reflux disease (GERD). This is when stomach contents move back up into the esophagus (reflux).  Repeated vomiting.  An allergic-type reaction, especially caused by food allergies (eosinophilic esophagitis).  Injury to the esophagus by swallowing large pills with or without water, or swallowing certain types of medicines.  Swallowing (ingesting) harmful chemicals, such as household cleaning products.  Heavy alcohol use.  An infection of the esophagus.This most often occurs in people who have a weakened immune system.  Radiation or chemotherapy treatment for cancer.  Certain diseases such as sarcoidosis, Crohn disease, and scleroderma. What are the signs or symptoms? Symptoms of this condition include:  Difficult or painful swallowing.  Pain with swallowing acidic liquids, such as citrus juices.   Pain with burping.  Chest pain.  Difficulty breathing.  Nausea.  Vomiting.  Pain in the abdomen.  Weight loss.  Ulcers in the mouth.  Patches of white material in the mouth (candidiasis).  Fever.  Coughing up blood or vomiting blood.  Stool that is black, tarry, or bright red. How is this diagnosed? Your health care provider will take a medical history and perform a physical exam. You may also have other tests, including:  An endoscopy to examine your stomach and esophagus with a small camera.  A test that measures the acidity level in your esophagus.  A test that measures how much pressure is on your esophagus.  A barium swallow or modified barium swallow to show the shape, size, and functioning of your esophagus.  Allergy tests. How is this treated? Treatment for this condition depends on the cause of your esophagitis. In some cases, steroids or other medicines may be given to help relieve your symptoms or to treat the underlying cause of your condition. You may have to make some lifestyle changes, such as:  Avoiding alcohol.  Quitting smoking.  Changing your diet.  Exercising.  Changing your sleep habits and your sleep environment. Follow these instructions at home: Take these actions to decrease your discomfort and to help avoid complications. Diet  Follow a diet as recommended by your health care provider. This may involve avoiding foods and drinks such as: ? Coffee and tea (with or without caffeine). ? Drinks that contain alcohol. ? Energy drinks and sports drinks. ?  Carbonated drinks or sodas. ? Chocolate and cocoa. ? Peppermint and mint flavorings. ? Garlic and onions. ? Horseradish. ? Spicy and acidic foods, including peppers, chili powder, curry powder, vinegar, hot sauces, and barbecue sauce. ? Citrus fruit juices and citrus fruits, such as oranges, lemons, and limes. ? Tomato-based foods, such as red sauce, chili, salsa, and pizza with red  sauce. ? Fried and fatty foods, such as donuts, french fries, potato chips, and high-fat dressings. ? High-fat meats, such as hot dogs and fatty cuts of red and white meats, such as rib eye steak, sausage, ham, and bacon. ? High-fat dairy items, such as whole milk, butter, and cream cheese.  Eat small, frequent meals instead of large meals.  Avoid drinking large amounts of liquid with your meals.  Avoid eating meals during the 2-3 hours before bedtime.  Avoid lying down right after you eat.  Do not exercise right after you eat.  Avoid foods and drinks that seem to make your symptoms worse. General instructions  Pay attention to any changes in your symptoms.  Take over-the-counter and prescription medicines only as told by your health care provider. Do not take aspirin, ibuprofen, or other NSAIDs unless your health care provider told you to do so.  If you have trouble taking pills, use a pill splitter to decrease the size of the pill. This will decrease the chance of the pill getting stuck or injuring your esophagus on the way down. Also, drink water after you take a pill.  Do not use any tobacco products, including cigarettes, chewing tobacco, and e-cigarettes. If you need help quitting, ask your health care provider.  Wear loose-fitting clothing. Do not wear anything tight around your waist that causes pressure on your abdomen.  Raise (elevate) the head of your bed about 6 inches (15 cm).  Try to reduce your stress, such as with yoga or meditation. If you need help reducing stress, ask your health care provider.  If you are overweight, reduce your weight to an amount that is healthy for you. Ask your health care provider for guidance about a safe weight loss goal.  Keep all follow-up visits as told by your health care provider. This is important. Contact a health care provider if:  You have new symptoms.  You have unexplained weight loss.  You have difficulty swallowing, or  it hurts to swallow.  You have wheezing or a persistent cough.  Your symptoms do not improve with treatment.  You have frequent heartburn for more than two weeks. Get help right away if:  You have severe pain in your arms, neck, jaw, teeth, or back.  You feel sweaty, dizzy, or light-headed.  You have chest pain or shortness of breath.  You vomit and your vomit looks like blood or coffee grounds.  Your stool is bloody or black.  You have a fever.  You cannot swallow, drink, or eat. This information is not intended to replace advice given to you by your health care provider. Make sure you discuss any questions you have with your health care provider. Document Released: 05/18/2004 Document Revised: 09/16/2015 Document Reviewed: 08/05/2014 Elsevier Interactive Patient Education  2019 [employer].

## 2018-09-30 NOTE — Progress Notes (Signed)
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 [facility])    08/02/2018 Cancer Staging    Staging form: Oral Cavity, AJCC 8th Edition - Clinical stage from 08/02/2018: Stage Unknown (cTX, cN2a, cM0) - Signed by Lloyd Huger, MD on 08/02/2018    08/23/2018 -  Chemotherapy    The patient had palonosetron (ALOXI) injection 0.25 mg, 0.25 mg, Intravenous,  Once, 5 of 7 cycles Administration: 0.25 mg (08/23/2018), 0.25 mg (08/30/2018), 0.25 mg (09/06/2018), 0.25 mg (09/13/2018), 0.25 mg (09/20/2018) CISplatin (PLATINOL) 92 mg in sodium chloride 0.9 % 250 mL chemo infusion, 40 mg/m2 = 92 mg, Intravenous,  Once, 5 of 7 cycles Administration: 92 mg (08/23/2018), 92 mg (08/30/2018), 92 mg (09/06/2018), 92 mg (09/13/2018), 92 mg (09/20/2018) fosaprepitant (EMEND) 150 mg, dexamethasone (DECADRON) 12 mg in sodium chloride 0.9 % 145 mL IVPB, , Intravenous,  Once, 5 of 7 cycles Administration:  (08/23/2018),  (08/30/2018),  (09/06/2018),  (09/13/2018),  (09/20/2018)  for chemotherapy treatment.      Subjective Data: Patient is a 58 year old male who presents to [facility] for complaints of weakness, dehydration, dysphasia and malnutrition.  He has completed a total of 5 cycles of cisplatin concurrent with radiation.  Cycle 6 was held due to thrombocytopenia.  Plan is for completion of radiation on 10/10/2018.   Over the course of the  past several weeks his dysphagia has worsened.  Current medication regimen for dysphagia includes Carafate and Magic mouthwash.  He is unable to swallow any consistency of foods.  He can tolerate small amounts of liquids including water, ensures and Gatorade.  He has no appetite but does feel thirsty.  Complains of pain with each swallow.  Denies mouth sores.  Weight today is 195 pounds which represents an 8 pound weight loss since 09/27/2018.  ECOG: 1 - Symptomatic but completely ambulatory    Subjective:     Dustin Hall is a 58 y.o. male who presents for evaluation of nausea, vomiting, and dehydration.  It has been present for 3 weeks.  Associated signs & symptoms:  Painful swallowing and mouth pain d/t radiation. The following portions of the patient's history were reviewed and updated as appropriate: allergies, current medications, past family history, past medical history, past social history, past surgical history and problem list. Review of Systems A comprehensive review of systems was negative except for: Constitutional: positive for anorexia, fatigue, malaise and weight loss Ears, nose, mouth, throat, and face: positive for sore mouth and sore throat Cardiovascular: positive for fatigue Musculoskeletal: positive for muscle weakness       Objective:    BP 126/90 (BP Location: Left Arm, Patient Position: Sitting)   Pulse 89   Temp (!) 96.9 F (36.1 C) (Tympanic)   Resp 18   Ht 6' (1.829 m)   Wt 195 lb 8.8 oz (88.7 kg)   BMI 26.52 kg/m  General appearance: alert, fatigued, no distress and pale Head: Normocephalic, without obvious  abnormality, atraumatic Throat: abnormal findings: aphthous ulceration, mild oropharyngeal erythema and thrush Neck: no adenopathy, no carotid bruit, no JVD, supple, symmetrical, trachea midline and thyroid not enlarged, symmetric, no tenderness/mass/nodules Lungs: clear to auscultation bilaterally Heart: regular rate and rhythm, S1, S2 normal, no  murmur, click, rub or gallop Abdomen: soft, non-tender; bowel sounds normal; no masses,  no organomegaly Neurologic: Grossly normal    Assessment:     Esophagitis/stomatitis secondary to concurrent radiation with weekly cisplatin.  Plan:    Antiemetics per medication orders. Dietary guidelines discussed. Discussed the diagnosis with the patient. All questions answered. Neurosurgeon distributed. Labs per orders.   Squamous cell cancer of head and neck; unknown primary: Status post 5 cycles of weekly cisplatin with concurrent radiation.  Cycle 6 held due to thrombocytopenia.  Tolerated well up until approximately 1 week ago.  Began having painful swallowing and dysphasia.  He was prescribed Carafate and Magic mouthwash with minimal improvement.  Plan is for 2 additional cycles of cisplatin and completion of radiation next week (10/10/18).  Dehydration/malnutrition: Likely all due to current treatment regimen.  Approximately 1 week ago developed significant dysphasia and painful swallowing.  He has had a dramatic weight loss over the past 2 weeks.  I suspect patient will need a break from both radiation and chemotherapy until symptoms resolve or improve dramatically.  While in clinic today, will give 1 L NaCl, 10 mg Decadron and 4 mg Zofran.  Prior to clinic, patient self-administered 8 mg Zofran sublingual.  Additionally, will prescribe doxepin 0.5% mouthwash for painful and irritation in mouth and will request free samples for helios dietary supplement to help promote and maintain epithelial and mucosal tissue and cells that line the digestive tract.   Disposition: Patient to return to clinic for daily radiation. Stat dietary referral. [name] will see him Thursday.  Return to clinic on Friday for assessment prior to cycle 6 cisplatin. Have ordered Helios samples to be delivered to [facility] center.  I have also encouraged the patient to order some from the in [place] or [employer].   I will reach out to [place] [facility] to see if we have availability for reimbursement given this is expensive ($55 per container).   Greater than 50% was spent in counseling and coordination of care with this patient including but not limited to discussion of the relevant topics above (See A&P) including, but not limited to diagnosis and management of acute and chronic medical conditions.   Faythe Casa, NP 10/01/2018 11:39 AM

## 2018-10-01 ENCOUNTER — Ambulatory Visit
Admission: RE | Admit: 2018-10-01 | Discharge: 2018-10-01 | Disposition: A | Discharge status: Home / Self Care | Payer: BC Managed Care – PPO | Source: Ambulatory Visit | Attending: Radiation Oncology | Admitting: Radiation Oncology

## 2018-10-01 ENCOUNTER — Other Ambulatory Visit: Payer: Self-pay

## 2018-10-01 DIAGNOSIS — Z510 Encounter for antineoplastic radiation therapy: Secondary | ICD-10-CM | POA: Diagnosis not present

## 2018-10-02 ENCOUNTER — Ambulatory Visit: Payer: BC Managed Care – PPO

## 2018-10-03 ENCOUNTER — Ambulatory Visit: Payer: BC Managed Care – PPO

## 2018-10-03 ENCOUNTER — Telehealth: Payer: Self-pay

## 2018-10-03 NOTE — Telephone Encounter (Signed)
Nutrition Assessment   Reason for Assessment:   Referral from Rulon Abide, NP for weight loss, poor appetite, head and neck cancer patient   ASSESSMENT:  58 year old male with squamous cell cancer of head and neck of unknown primiary, p16+.  Patient receiving concurrent chemotherapy and radiation.  Cycle 6 of chemotherapy was held last week due to thrombocytopenia.  Radiation has been held this week. Noted [facility] on 6/8 for dehydration, dysphagia, malnutrition.   Spoke with wife Suanne Marker via phone this am.  Suanne Marker reports that she is not currently at home but watching her grandkids for her daughter for short time.  Reports that patient is home sleeping and mother in law is with him.  Suanne Marker reports that patient has not eaten any solid foods in the last 3 weeks due to pain on swallowing, thick mucous, and taste alterations.  Suanne Marker reports that patient was drinking boost but has stopped and not drank any in the last 3 days due to above side effects.  Wife reports that she has tried smoothies, tried pureeing foods without success. Wife tearful at times during conversation and reports that she is concerned about his nutrition.  Wife reports that he is only drinking orange gatorade at this time about 1/4 of large bottle.      Nutrition Focused Physical Exam: deferred   Medications: decadron, MVI, omega 3, carafate, zofran, compazine, phenergan, magic mouthwash   Labs: glucose 124, BUN 37, creatinine 1.24   Anthropometrics:   Height: 72 inches Weight: 195 lb on 6/8  UBW: 238 lb on 07/23/2018 BMI: 26  18% weight loss in the last 2 1/2 months   Estimated Energy Needs  Kcals: 2175-2610 calories Protein: 130-174 g Fluid: 2.6 L   NUTRITION DIAGNOSIS: Inadequate oral intake related to cancer related treatment side effects as evidenced by 18% weight loss in 2 1/2 months and eating < 50% of energy intake for > or equal to 5 days.     MALNUTRITION DIAGNOSIS: Patient meets criteria for  severe malnutrition with 18% weight loss in 2 1/2 months and eating < 50% of energy needs in > or equal to 5 days.    INTERVENTION:  Wife reports that they have tried high calorie shakes (boost, smoothies, atkins, premier protein shakes) and patient refusing to drink them over the last 3 days due to pain.  Wife has tried pureeing foods and patient refuses.  Wife reports medications are not working and patient's nutrition is going down. Recommend PEG placement at this time due to 18% weight loss in the last 2 1/2 months, eating < 50% of energy needs for > 5 days, concurrent chemo and radiation treatment and side effects of treatment likely to persist after treatment is completed.  Discussed recommendations with wife and she agrees but not sure if patient will agree.  Provided wife with contact information and encouraged her to have patient call RD to discuss nutrition.  Will forward recommendations to MD Finnegan.   Offered support to wife and active listening provided.   Recommend SLP evaluation and follow-up with patient receiving radiation. Contact number given to wife.   MONITORING, EVALUATION, GOAL: weight trends, intake  Next Visit: phone f/u Thursday, June 18  [name] B. Zenia Resides, [place], [place] Registered Dietitian [phone] (pager)

## 2018-10-04 ENCOUNTER — Inpatient Hospital Stay (HOSPITAL_BASED_OUTPATIENT_CLINIC_OR_DEPARTMENT_OTHER): Payer: BC Managed Care – PPO | Admitting: Oncology

## 2018-10-04 ENCOUNTER — Other Ambulatory Visit: Payer: Self-pay

## 2018-10-04 ENCOUNTER — Other Ambulatory Visit: Payer: Self-pay | Admitting: *Deleted

## 2018-10-04 ENCOUNTER — Ambulatory Visit: Payer: BC Managed Care – PPO

## 2018-10-04 ENCOUNTER — Inpatient Hospital Stay: Payer: BC Managed Care – PPO

## 2018-10-04 ENCOUNTER — Encounter: Payer: Self-pay | Admitting: Oncology

## 2018-10-04 VITALS — BP 107/78 | HR 82 | Temp 97.2°F | Wt 191.3 lb

## 2018-10-04 DIAGNOSIS — C770 Secondary and unspecified malignant neoplasm of lymph nodes of head, face and neck: Secondary | ICD-10-CM | POA: Diagnosis not present

## 2018-10-04 DIAGNOSIS — C779 Secondary and unspecified malignant neoplasm of lymph node, unspecified: Secondary | ICD-10-CM

## 2018-10-04 DIAGNOSIS — D6959 Other secondary thrombocytopenia: Secondary | ICD-10-CM | POA: Diagnosis not present

## 2018-10-04 DIAGNOSIS — R634 Abnormal weight loss: Secondary | ICD-10-CM

## 2018-10-04 DIAGNOSIS — Z510 Encounter for antineoplastic radiation therapy: Secondary | ICD-10-CM | POA: Diagnosis not present

## 2018-10-04 DIAGNOSIS — C801 Malignant (primary) neoplasm, unspecified: Secondary | ICD-10-CM | POA: Diagnosis not present

## 2018-10-04 DIAGNOSIS — D701 Agranulocytosis secondary to cancer chemotherapy: Secondary | ICD-10-CM | POA: Diagnosis not present

## 2018-10-04 DIAGNOSIS — C760 Malignant neoplasm of head, face and neck: Secondary | ICD-10-CM | POA: Diagnosis not present

## 2018-10-04 DIAGNOSIS — F419 Anxiety disorder, unspecified: Secondary | ICD-10-CM

## 2018-10-04 LAB — CBC WITH DIFFERENTIAL/PLATELET
Abs Immature Granulocytes: 0.02 10*3/uL (ref 0.00–0.07)
Basophils Absolute: 0 10*3/uL (ref 0.0–0.1)
Basophils Relative: 1 %
Eosinophils Absolute: 0 10*3/uL (ref 0.0–0.5)
Eosinophils Relative: 1 %
HCT: 36.1 % — ABNORMAL LOW (ref 39.0–52.0)
Hemoglobin: 12.4 g/dL — ABNORMAL LOW (ref 13.0–17.0)
Immature Granulocytes: 2 %
Lymphocytes Relative: 26 %
Lymphs Abs: 0.2 10*3/uL — ABNORMAL LOW (ref 0.7–4.0)
MCH: 29.9 pg (ref 26.0–34.0)
MCHC: 34.3 g/dL (ref 30.0–36.0)
MCV: 87 fL (ref 80.0–100.0)
Monocytes Absolute: 0.1 10*3/uL (ref 0.1–1.0)
Monocytes Relative: 12 %
Neutro Abs: 0.5 10*3/uL — ABNORMAL LOW (ref 1.7–7.7)
Neutrophils Relative %: 58 %
Platelets: 69 10*3/uL — ABNORMAL LOW (ref 150–400)
RBC: 4.15 MIL/uL — ABNORMAL LOW (ref 4.22–5.81)
RDW: 13.3 % (ref 11.5–15.5)
Smear Review: NORMAL
WBC: 0.8 10*3/uL — CL (ref 4.0–10.5)
nRBC: 0 % (ref 0.0–0.2)

## 2018-10-04 LAB — BASIC METABOLIC PANEL
Anion gap: 11 (ref 5–15)
BUN: 42 mg/dL — ABNORMAL HIGH (ref 6–20)
CO2: 26 mmol/L (ref 22–32)
Calcium: 9.6 mg/dL (ref 8.9–10.3)
Chloride: 104 mmol/L (ref 98–111)
Creatinine, Ser: 1.17 mg/dL (ref 0.61–1.24)
GFR calc Af Amer: >60 mL/min (ref >60)
GFR calc non Af Amer: >60 mL/min (ref >60)
Glucose, Bld: 175 mg/dL — ABNORMAL HIGH (ref 70–99)
Potassium: 5.4 mmol/L — ABNORMAL HIGH (ref 3.5–5.1)
Sodium: 141 mmol/L (ref 135–145)

## 2018-10-04 MED ORDER — ACETAMINOPHEN-CODEINE 120-12 MG/5ML PO SOLN: 12.5000 mL | ORAL | 0 refills | Status: DC | PRN

## 2018-10-04 MED ORDER — ALPRAZOLAM 0.25 MG PO TABS: 0.2500 mg | ORAL_TABLET | Freq: Every day | ORAL | 0 refills | Status: DC | PRN

## 2018-10-04 MED ORDER — ONDANSETRON HCL 8 MG PO TABS: 8.0000 mg | ORAL_TABLET | Freq: Two times a day (BID) | ORAL | 1 refills | Status: DC | PRN

## 2018-10-04 NOTE — Addendum Note (Signed)
Addended by: Betti Cruz on: 10/04/2018 11:46 AM   Modules accepted: Orders

## 2018-10-04 NOTE — Telephone Encounter (Signed)
Wife requested Tylenol # 3 be refilled in Liquid form

## 2018-10-06 NOTE — Progress Notes (Signed)
[facility]  [phone] [phone]  ID: Dustin Hall OB: 01-24-1961  MR#: [id number]  WEX#:[id number]  Patient Care Team: Jodelle Green, FNP as PCP - General (Family Medicine)  CHIEF COMPLAINT: Squamous cell cancer of the head and neck, unknown primary  INTERVAL HISTORY: Patient returns to clinic today for repeat laboratory work and further evaluation.  He feels significantly improved since last week, but still has a poor appetite and has lost several pounds in the interim.  He has reinitiated XRT and has approximately 4 fractions left.  He continues to have dysphagia.  He has chronic weakness and fatigue.  He has no neurologic complaints. He denies any recent fevers or illnesses. He has no chest pain, shortness of breath, cough, or hemoptysis.  He denies any nausea, vomiting, constipation, or diarrhea.  He has no urinary complaints.  Patient offers no further specific complaints today.  REVIEW OF SYSTEMS:   Review of Systems  Constitutional: Positive for malaise/fatigue and weight loss. Negative for fever.  HENT: Positive for sore throat.   Respiratory: Negative.  Negative for cough and hemoptysis.   Cardiovascular: Negative.  Negative for chest pain and leg swelling.  Gastrointestinal: Negative.  Negative for abdominal pain and nausea.  Genitourinary: Negative.  Negative for dysuria.  Musculoskeletal: Negative.  Negative for neck pain.  Skin: Negative.  Negative for rash.  Neurological: Positive for weakness. Negative for dizziness, speech change, focal weakness and headaches.  Endo/Heme/Allergies: Does not bruise/bleed easily.  Psychiatric/Behavioral: Negative.  The patient is not nervous/anxious.     As per HPI. Otherwise, a complete review of systems is negative.  PAST MEDICAL HISTORY: History reviewed. No pertinent past medical history.  PAST SURGICAL HISTORY: Past Surgical History:  Procedure Laterality Date  . APPENDECTOMY    .  THROAT SURGERY Right     FAMILY HISTORY: Family History  Problem Relation Age of Onset  . Aortic aneurysm Father     ADVANCED DIRECTIVES (Y/N):  N  HEALTH MAINTENANCE: Social History   Tobacco Use  . Smoking status: Never Smoker  . Smokeless tobacco: Current User    Types: Chew  Substance Use Topics  . Alcohol use: Yes    Alcohol/week: 0.0 standard drinks    Comment: occasional  . Drug use: No     Colonoscopy:  PAP:  Bone density:  Lipid panel:  No Known Allergies  Current Outpatient Medications  Medication Sig Dispense Refill  . dexamethasone (DECADRON) 4 MG tablet Take 1 tablet (4 mg total) by mouth 2 (two) times daily. 50 tablet 0  . doxepin (SINEQUAN) 10 MG/ML solution SWISH AND SPIT 5 ML 3 6 TIMES A DAY AS DIRECTED    . ondansetron (ZOFRAN) 8 MG tablet Take 1 tablet (8 mg total) by mouth 2 (two) times daily as needed. Start on the third day after chemotherapy. 30 tablet 1  . prochlorperazine (COMPAZINE) 10 MG tablet Take 1 tablet (10 mg total) by mouth every 6 (six) hours as needed (Nausea or vomiting). 60 tablet 1  . silver sulfADIAZINE (SILVADENE) 1 % cream Apply 1 application topically 2 (two) times daily. 50 g 2  . acetaminophen-codeine 120-12 MG/5ML solution Take 12.5 mLs by mouth every 4 (four) hours as needed for moderate pain. (Patient not taking: Reported on 10/10/2018) 480 mL 0  . ALPRAZolam (XANAX) 0.25 MG tablet Take 1 tablet (0.25 mg total) by mouth daily as needed for anxiety. (Patient not taking: Reported on 10/10/2018) 15 tablet 0  . promethazine (  PHENERGAN) 25 MG tablet Take 1 tablet (25 mg total) by mouth every 6 (six) hours as needed for nausea or vomiting. (Patient not taking: Reported on 10/10/2018) 30 tablet 0  . sucralfate (CARAFATE) 1 g tablet Take 1 tablet (1 g total) by mouth 3 (three) times daily before meals. Dissolve in warm water, swish and swallow (Patient not taking: Reported on 10/10/2018) 90 tablet 6   No current facility-administered  medications for this visit.     OBJECTIVE: Vitals:   10/10/18 1125  BP: 115/85  Pulse: 81  Temp: (!) 96.3 F (35.7 C)     Body mass index is 25.63 kg/m.    ECOG FS:0 - Asymptomatic  General: Well-developed, well-nourished, no acute distress. Eyes: Pink conjunctiva, anicteric sclera. HEENT: Normocephalic, moist mucous membranes, clear oropharnyx.  No palpable lymphadenopathy. Lungs: Clear to auscultation bilaterally. Heart: Regular rate and rhythm. No rubs, murmurs, or gallops. Abdomen: Soft, nontender, nondistended. No organomegaly noted, normoactive bowel sounds. Musculoskeletal: No edema, cyanosis, or clubbing. Neuro: Alert, answering all questions appropriately. Cranial nerves grossly intact. Skin: No rashes or petechiae noted. Psych: Normal affect.  LAB RESULTS:  Lab Results  Component Value Date   NA 134 (L) 10/10/2018   K 3.5 10/10/2018   CL 97 (L) 10/10/2018   CO2 25 10/10/2018   GLUCOSE 154 (H) 10/10/2018   BUN 36 (H) 10/10/2018   CREATININE 1.03 10/10/2018   CALCIUM 9.1 10/10/2018   PROT 8.3 (H) 09/30/2018   ALBUMIN 4.4 09/30/2018   AST 21 09/30/2018   ALT 33 09/30/2018   ALKPHOS 91 09/30/2018   BILITOT 1.1 09/30/2018   GFRNONAA >60 10/10/2018   GFRAA >60 10/10/2018    Lab Results  Component Value Date   WBC 3.7 (L) 10/10/2018   NEUTROABS 2.2 10/10/2018   HGB 12.0 (L) 10/10/2018   HCT 34.5 (L) 10/10/2018   MCV 86.9 10/10/2018   PLT 239 10/10/2018     STUDIES: No results found.  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  PET scan results from August 06, 2018 reviewed independently with no obvious hypermetabolic activity in positive lymph node or other area to suggest a primary.  Continue daily XRT completing on October 16, 2018.  Given patient's persistent pancytopenia,  weight loss, and dysphasia cisplatin has been discontinued.  Patient received his fifth and final infusion on Sep 20, 2018.  Return to clinic in 2 weeks for repeat laboratory work and further evaluation. 2.  Thrombocytopenia: Resolved. 3.  Neutropenia: Resolved. 4.  Dysphasia: Improving.  Secondary to XRT.  Continue Carafate and Magic mouthwash as prescribed. 5.  Weight loss: Previously placement of a feeding tube was discussed, but patient declined.  Continue to monitor closely.   6.  Anxiety: Continue Xanax as needed. 7.  Hyponatremia: Resolved.  Patient expressed understanding and was in agreement with this plan. He also understands that He can call clinic at any time with any questions, concerns, or complaints.   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

## 2018-10-07 ENCOUNTER — Ambulatory Visit
Admission: RE | Admit: 2018-10-07 | Discharge: 2018-10-07 | Disposition: A | Discharge status: Home / Self Care | Payer: BC Managed Care – PPO | Source: Ambulatory Visit | Attending: Radiation Oncology | Admitting: Radiation Oncology

## 2018-10-07 ENCOUNTER — Other Ambulatory Visit: Payer: Self-pay

## 2018-10-07 DIAGNOSIS — Z510 Encounter for antineoplastic radiation therapy: Secondary | ICD-10-CM | POA: Diagnosis not present

## 2018-10-08 ENCOUNTER — Other Ambulatory Visit: Payer: Self-pay

## 2018-10-08 ENCOUNTER — Ambulatory Visit
Admission: RE | Admit: 2018-10-08 | Discharge: 2018-10-08 | Disposition: A | Discharge status: Home / Self Care | Payer: BC Managed Care – PPO | Source: Ambulatory Visit | Attending: Radiation Oncology | Admitting: Radiation Oncology

## 2018-10-08 DIAGNOSIS — Z510 Encounter for antineoplastic radiation therapy: Secondary | ICD-10-CM | POA: Diagnosis not present

## 2018-10-09 ENCOUNTER — Ambulatory Visit
Admission: RE | Admit: 2018-10-09 | Discharge: 2018-10-09 | Disposition: A | Discharge status: Home / Self Care | Payer: BC Managed Care – PPO | Source: Ambulatory Visit | Attending: Radiation Oncology | Admitting: Radiation Oncology

## 2018-10-09 ENCOUNTER — Other Ambulatory Visit: Payer: Self-pay

## 2018-10-09 DIAGNOSIS — Z510 Encounter for antineoplastic radiation therapy: Secondary | ICD-10-CM | POA: Diagnosis not present

## 2018-10-10 ENCOUNTER — Other Ambulatory Visit: Payer: Self-pay

## 2018-10-10 ENCOUNTER — Encounter: Payer: Self-pay | Admitting: Oncology

## 2018-10-10 ENCOUNTER — Ambulatory Visit
Admission: RE | Admit: 2018-10-10 | Discharge: 2018-10-10 | Disposition: A | Discharge status: Home / Self Care | Payer: BC Managed Care – PPO | Source: Ambulatory Visit | Attending: Radiation Oncology | Admitting: Radiation Oncology

## 2018-10-10 ENCOUNTER — Ambulatory Visit: Payer: BC Managed Care – PPO

## 2018-10-10 ENCOUNTER — Inpatient Hospital Stay: Payer: BC Managed Care – PPO

## 2018-10-10 ENCOUNTER — Inpatient Hospital Stay (HOSPITAL_BASED_OUTPATIENT_CLINIC_OR_DEPARTMENT_OTHER): Payer: BC Managed Care – PPO | Admitting: Oncology

## 2018-10-10 VITALS — BP 115/85 | HR 81 | Temp 96.3°F | Ht 72.0 in | Wt 189.0 lb

## 2018-10-10 DIAGNOSIS — F419 Anxiety disorder, unspecified: Secondary | ICD-10-CM

## 2018-10-10 DIAGNOSIS — C779 Secondary and unspecified malignant neoplasm of lymph node, unspecified: Secondary | ICD-10-CM

## 2018-10-10 DIAGNOSIS — Z510 Encounter for antineoplastic radiation therapy: Secondary | ICD-10-CM | POA: Diagnosis not present

## 2018-10-10 DIAGNOSIS — C770 Secondary and unspecified malignant neoplasm of lymph nodes of head, face and neck: Secondary | ICD-10-CM

## 2018-10-10 DIAGNOSIS — C801 Malignant (primary) neoplasm, unspecified: Secondary | ICD-10-CM | POA: Diagnosis not present

## 2018-10-10 DIAGNOSIS — R634 Abnormal weight loss: Secondary | ICD-10-CM | POA: Diagnosis not present

## 2018-10-10 LAB — BASIC METABOLIC PANEL
Anion gap: 12 (ref 5–15)
BUN: 36 mg/dL — ABNORMAL HIGH (ref 6–20)
CO2: 25 mmol/L (ref 22–32)
Calcium: 9.1 mg/dL (ref 8.9–10.3)
Chloride: 97 mmol/L — ABNORMAL LOW (ref 98–111)
Creatinine, Ser: 1.03 mg/dL (ref 0.61–1.24)
GFR calc Af Amer: >60 mL/min (ref >60)
GFR calc non Af Amer: >60 mL/min (ref >60)
Glucose, Bld: 154 mg/dL — ABNORMAL HIGH (ref 70–99)
Potassium: 3.5 mmol/L (ref 3.5–5.1)
Sodium: 134 mmol/L — ABNORMAL LOW (ref 135–145)

## 2018-10-10 LAB — CBC WITH DIFFERENTIAL/PLATELET
Abs Immature Granulocytes: 0.57 10*3/uL — ABNORMAL HIGH (ref 0.00–0.07)
Basophils Absolute: 0 10*3/uL (ref 0.0–0.1)
Basophils Relative: 1 %
Eosinophils Absolute: 0 10*3/uL (ref 0.0–0.5)
Eosinophils Relative: 0 %
HCT: 34.5 % — ABNORMAL LOW (ref 39.0–52.0)
Hemoglobin: 12 g/dL — ABNORMAL LOW (ref 13.0–17.0)
Immature Granulocytes: 15 %
Lymphocytes Relative: 13 %
Lymphs Abs: 0.5 10*3/uL — ABNORMAL LOW (ref 0.7–4.0)
MCH: 30.2 pg (ref 26.0–34.0)
MCHC: 34.8 g/dL (ref 30.0–36.0)
MCV: 86.9 fL (ref 80.0–100.0)
Monocytes Absolute: 0.5 10*3/uL (ref 0.1–1.0)
Monocytes Relative: 12 %
Neutro Abs: 2.2 10*3/uL (ref 1.7–7.7)
Neutrophils Relative %: 59 %
Platelets: 239 10*3/uL (ref 150–400)
RBC: 3.97 MIL/uL — ABNORMAL LOW (ref 4.22–5.81)
RDW: 13.8 % (ref 11.5–15.5)
Smear Review: ADEQUATE
WBC: 3.7 10*3/uL — ABNORMAL LOW (ref 4.0–10.5)
nRBC: 0 % (ref 0.0–0.2)

## 2018-10-10 NOTE — Progress Notes (Signed)
Nutrition  RD working remotely.  Called for nutrition follow-up.  Left message on wife's mobile with call back number.  Maral Lampe B. Zenia Resides, [place], [place] Registered Dietitian [phone] (pager)

## 2018-10-10 NOTE — Progress Notes (Signed)
Patient stated that he has no appetite. Patient lost 2 lbs since last week. Patient stated that he is not hungry and food does not taste good. Patient denied nausea, vomiting, fever, chills, constipation or diarrhea.

## 2018-10-11 ENCOUNTER — Ambulatory Visit: Payer: BC Managed Care – PPO

## 2018-10-11 ENCOUNTER — Ambulatory Visit
Admission: RE | Admit: 2018-10-11 | Discharge: 2018-10-11 | Disposition: A | Discharge status: Home / Self Care | Payer: BC Managed Care – PPO | Source: Ambulatory Visit | Attending: Radiation Oncology | Admitting: Radiation Oncology

## 2018-10-11 DIAGNOSIS — Z510 Encounter for antineoplastic radiation therapy: Secondary | ICD-10-CM | POA: Diagnosis not present

## 2018-10-14 ENCOUNTER — Ambulatory Visit
Admission: RE | Admit: 2018-10-14 | Discharge: 2018-10-14 | Disposition: A | Discharge status: Home / Self Care | Payer: BC Managed Care – PPO | Source: Ambulatory Visit | Attending: Radiation Oncology | Admitting: Radiation Oncology

## 2018-10-14 ENCOUNTER — Other Ambulatory Visit: Payer: Self-pay

## 2018-10-14 DIAGNOSIS — Z510 Encounter for antineoplastic radiation therapy: Secondary | ICD-10-CM | POA: Diagnosis not present

## 2018-10-15 ENCOUNTER — Other Ambulatory Visit: Payer: Self-pay

## 2018-10-15 ENCOUNTER — Ambulatory Visit
Admission: RE | Admit: 2018-10-15 | Discharge: 2018-10-15 | Disposition: A | Discharge status: Home / Self Care | Payer: BC Managed Care – PPO | Source: Ambulatory Visit | Attending: Radiation Oncology | Admitting: Radiation Oncology

## 2018-10-15 DIAGNOSIS — Z510 Encounter for antineoplastic radiation therapy: Secondary | ICD-10-CM | POA: Diagnosis not present

## 2018-10-16 ENCOUNTER — Other Ambulatory Visit: Payer: Self-pay

## 2018-10-16 ENCOUNTER — Ambulatory Visit
Admission: RE | Admit: 2018-10-16 | Discharge: 2018-10-16 | Disposition: A | Discharge status: Home / Self Care | Payer: BC Managed Care – PPO | Source: Ambulatory Visit | Attending: Radiation Oncology | Admitting: Radiation Oncology

## 2018-10-16 DIAGNOSIS — Z510 Encounter for antineoplastic radiation therapy: Secondary | ICD-10-CM | POA: Diagnosis not present

## 2018-10-20 NOTE — Progress Notes (Deleted)
Dustin Hall  [phone] [phone]) [phone]  ID: Marella Bile OB: 11-21-1960  MR#: [id number]  CHE#:[id number]  Patient Care Team: Jodelle Green, FNP as PCP - General (Family Medicine)  CHIEF COMPLAINT: Squamous cell cancer of the head and neck, unknown primary  INTERVAL HISTORY: Patient returns to clinic today for repeat laboratory work and further evaluation.  He feels significantly improved since last week, but still has a poor appetite and has lost several pounds in the interim.  He has reinitiated XRT and has approximately 4 fractions left.  He continues to have dysphagia.  He has chronic weakness and fatigue.  He has no neurologic complaints. He denies any recent fevers or illnesses. He has no chest pain, shortness of breath, cough, or hemoptysis.  He denies any nausea, vomiting, constipation, or diarrhea.  He has no urinary complaints.  Patient offers no further specific complaints today.  REVIEW OF SYSTEMS:   Review of Systems  Constitutional: Positive for malaise/fatigue and weight loss. Negative for fever.  HENT: Positive for sore throat.   Respiratory: Negative.  Negative for cough and hemoptysis.   Cardiovascular: Negative.  Negative for chest pain and leg swelling.  Gastrointestinal: Negative.  Negative for abdominal pain and nausea.  Genitourinary: Negative.  Negative for dysuria.  Musculoskeletal: Negative.  Negative for neck pain.  Skin: Negative.  Negative for rash.  Neurological: Positive for weakness. Negative for dizziness, speech change, focal weakness and headaches.  Endo/Heme/Allergies: Does not bruise/bleed easily.  Psychiatric/Behavioral: Negative.  The patient is not nervous/anxious.     As per HPI. Otherwise, a complete review of systems is negative.  PAST MEDICAL HISTORY: No past medical history on file.  PAST SURGICAL HISTORY: Past Surgical History:  Procedure Laterality Date  . APPENDECTOMY    . THROAT SURGERY Right      FAMILY HISTORY: Family History  Problem Relation Age of Onset  . Aortic aneurysm Father     ADVANCED DIRECTIVES (Y/N):  N  HEALTH MAINTENANCE: Social History   Tobacco Use  . Smoking status: Never Smoker  . Smokeless tobacco: Current User    Types: Chew  Substance Use Topics  . Alcohol use: Yes    Alcohol/week: 0.0 standard drinks    Comment: occasional  . Drug use: No     Colonoscopy:  PAP:  Bone density:  Lipid panel:  No Known Allergies  Current Outpatient Medications  Medication Sig Dispense Refill  . acetaminophen-codeine 120-12 MG/5ML solution Take 12.5 mLs by mouth every 4 (four) hours as needed for moderate pain. (Patient not taking: Reported on 10/10/2018) 480 mL 0  . ALPRAZolam (XANAX) 0.25 MG tablet Take 1 tablet (0.25 mg total) by mouth daily as needed for anxiety. (Patient not taking: Reported on 10/10/2018) 15 tablet 0  . dexamethasone (DECADRON) 4 MG tablet Take 1 tablet (4 mg total) by mouth 2 (two) times daily. 50 tablet 0  . doxepin (SINEQUAN) 10 MG/ML solution SWISH AND SPIT 5 ML 3 6 TIMES A DAY AS DIRECTED    . ondansetron (ZOFRAN) 8 MG tablet Take 1 tablet (8 mg total) by mouth 2 (two) times daily as needed. Start on the third day after chemotherapy. 30 tablet 1  . prochlorperazine (COMPAZINE) 10 MG tablet Take 1 tablet (10 mg total) by mouth every 6 (six) hours as needed (Nausea or vomiting). 60 tablet 1  . promethazine (PHENERGAN) 25 MG tablet Take 1 tablet (25 mg total) by mouth every 6 (six) hours as needed for nausea  or vomiting. (Patient not taking: Reported on 10/10/2018) 30 tablet 0  . silver sulfADIAZINE (SILVADENE) 1 % cream Apply 1 application topically 2 (two) times daily. 50 g 2  . sucralfate (CARAFATE) 1 g tablet Take 1 tablet (1 g total) by mouth 3 (three) times daily before meals. Dissolve in warm water, swish and swallow (Patient not taking: Reported on 10/10/2018) 90 tablet 6   No current facility-administered medications for this  visit.     OBJECTIVE: There were no vitals filed for this visit.   There is no height or weight on file to calculate BMI.    ECOG FS:0 - Asymptomatic  General: Well-developed, well-nourished, no acute distress. Eyes: Pink conjunctiva, anicteric sclera. HEENT: Normocephalic, moist mucous membranes, clear oropharnyx.  No palpable lymphadenopathy. Lungs: Clear to auscultation bilaterally. Heart: Regular rate and rhythm. No rubs, murmurs, or gallops. Abdomen: Soft, nontender, nondistended. No organomegaly noted, normoactive bowel sounds. Musculoskeletal: No edema, cyanosis, or clubbing. Neuro: Alert, answering all questions appropriately. Cranial nerves grossly intact. Skin: No rashes or petechiae noted. Psych: Normal affect.  LAB RESULTS:  Lab Results  Component Value Date   NA 134 (L) 10/10/2018   K 3.5 10/10/2018   CL 97 (L) 10/10/2018   CO2 25 10/10/2018   GLUCOSE 154 (H) 10/10/2018   BUN 36 (H) 10/10/2018   CREATININE 1.03 10/10/2018   CALCIUM 9.1 10/10/2018   PROT 8.3 (H) 09/30/2018   ALBUMIN 4.4 09/30/2018   AST 21 09/30/2018   ALT 33 09/30/2018   ALKPHOS 91 09/30/2018   BILITOT 1.1 09/30/2018   GFRNONAA >60 10/10/2018   GFRAA >60 10/10/2018    Lab Results  Component Value Date   WBC 3.7 (L) 10/10/2018   NEUTROABS 2.2 10/10/2018   HGB 12.0 (L) 10/10/2018   HCT 34.5 (L) 10/10/2018   MCV 86.9 10/10/2018   PLT 239 10/10/2018     STUDIES: No results found.  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  PET scan results from August 06, 2018 reviewed independently with no obvious hypermetabolic activity in positive lymph node or other area to suggest a primary.  Continue daily XRT completing on October 16, 2018.  Given patient's persistent pancytopenia, weight loss, and dysphasia cisplatin has  been discontinued.  Patient received his fifth and final infusion on Sep 20, 2018.  Return to clinic in 2 weeks for repeat laboratory work and further evaluation. 2.  Thrombocytopenia: Resolved. 3.  Neutropenia: Resolved. 4.  Dysphasia: Improving.  Secondary to XRT.  Continue Carafate and Magic mouthwash as prescribed. 5.  Weight loss: Previously placement of a feeding tube was discussed, but patient declined.  Continue to monitor closely.   6.  Anxiety: Continue Xanax as needed. 7.  Hyponatremia: Resolved.  Patient expressed understanding and was in agreement with this plan. He also understands that He can call clinic at any time with any questions, concerns, or complaints.   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

## 2018-10-21 ENCOUNTER — Other Ambulatory Visit: Payer: Self-pay | Admitting: *Deleted

## 2018-10-21 MED ORDER — ACETAMINOPHEN-CODEINE 120-12 MG/5ML PO SOLN: 12.5000 mL | ORAL | 0 refills | Status: DC | PRN

## 2018-10-23 ENCOUNTER — Other Ambulatory Visit: Payer: Self-pay

## 2018-10-24 ENCOUNTER — Inpatient Hospital Stay: Payer: BC Managed Care – PPO | Admitting: Oncology

## 2018-10-24 ENCOUNTER — Inpatient Hospital Stay: Payer: BC Managed Care – PPO

## 2018-10-26 NOTE — Progress Notes (Deleted)
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  He feels significantly improved since last week, but still has a poor appetite and has lost several pounds in the interim.  He has reinitiated XRT and has approximately 4 fractions left.  He continues to have dysphagia.  He has chronic weakness and fatigue.  He has no neurologic complaints. He denies any recent fevers or illnesses. He has no chest pain, shortness of breath, cough, or hemoptysis.  He denies any nausea, vomiting, constipation, or diarrhea.  He has no urinary complaints.  Patient offers no further specific complaints today.  REVIEW OF SYSTEMS:   Review of Systems  Constitutional: Positive for malaise/fatigue and weight loss. Negative for fever.  HENT: Positive for sore throat.   Respiratory: Negative.  Negative for cough and hemoptysis.   Cardiovascular: Negative.  Negative for chest pain and leg swelling.  Gastrointestinal: Negative.  Negative for abdominal pain and nausea.  Genitourinary: Negative.  Negative for dysuria.  Musculoskeletal: Negative.  Negative for neck pain.  Skin: Negative.  Negative for rash.  Neurological: Positive for weakness. Negative for dizziness, speech change, focal weakness and headaches.  Endo/Heme/Allergies: Does not bruise/bleed easily.  Psychiatric/Behavioral: Negative.  The patient is not nervous/anxious.     As per HPI. Otherwise, a complete review of systems is negative.  PAST MEDICAL HISTORY: No past medical history on file.  PAST SURGICAL HISTORY: Past Surgical History:  Procedure Laterality Date  . APPENDECTOMY    . THROAT SURGERY Right      FAMILY HISTORY: Family History  Problem Relation Age of Onset  . Aortic aneurysm Father     ADVANCED DIRECTIVES (Y/N):  N  HEALTH MAINTENANCE: Social History   Tobacco Use  . Smoking status: Never Smoker  . Smokeless tobacco: Current User    Types: Chew  Substance Use Topics  . Alcohol use: Yes    Alcohol/week: 0.0 standard drinks    Comment: occasional  . Drug use: No     Colonoscopy:  PAP:  Bone density:  Lipid panel:  No Known Allergies  Current Outpatient Medications  Medication Sig Dispense Refill  . acetaminophen-codeine 120-12 MG/5ML solution Take 12.5 mLs by mouth every 4 (four) hours as needed for moderate pain. 480 mL 0  . ALPRAZolam (XANAX) 0.25 MG tablet Take 1 tablet (0.25 mg total) by mouth daily as needed for anxiety. (Patient not taking: Reported on 10/10/2018) 15 tablet 0  . dexamethasone (DECADRON) 4 MG tablet Take 1 tablet (4 mg total) by mouth 2 (two) times daily. 50 tablet 0  . doxepin (SINEQUAN) 10 MG/ML solution SWISH AND SPIT 5 ML 3 6 TIMES A DAY AS DIRECTED    . ondansetron (ZOFRAN) 8 MG tablet Take 1 tablet (8 mg total) by mouth 2 (two) times daily as needed. Start on the third day after chemotherapy. 30 tablet 1  . prochlorperazine (COMPAZINE) 10 MG tablet Take 1 tablet (10 mg total) by mouth every 6 (six) hours as needed (Nausea or vomiting). 60 tablet 1  . promethazine (PHENERGAN) 25 MG tablet Take 1 tablet (25 mg total) by mouth every 6 (six) hours as needed for nausea or vomiting. (Patient not taking: Reported  on 10/10/2018) 30 tablet 0  . silver sulfADIAZINE (SILVADENE) 1 % cream Apply 1 application topically 2 (two) times daily. 50 g 2  . sucralfate (CARAFATE) 1 g tablet Take 1 tablet (1 g total) by mouth 3 (three) times daily before meals. Dissolve in warm water, swish and swallow (Patient not taking: Reported on 10/10/2018) 90 tablet 6   No current facility-administered medications for this visit.     OBJECTIVE: There were no vitals  filed for this visit.   There is no height or weight on file to calculate BMI.    ECOG FS:0 - Asymptomatic  General: Well-developed, well-nourished, no acute distress. Eyes: Pink conjunctiva, anicteric sclera. HEENT: Normocephalic, moist mucous membranes, clear oropharnyx.  No palpable lymphadenopathy. Lungs: Clear to auscultation bilaterally. Heart: Regular rate and rhythm. No rubs, murmurs, or gallops. Abdomen: Soft, nontender, nondistended. No organomegaly noted, normoactive bowel sounds. Musculoskeletal: No edema, cyanosis, or clubbing. Neuro: Alert, answering all questions appropriately. Cranial nerves grossly intact. Skin: No rashes or petechiae noted. Psych: Normal affect.  LAB RESULTS:  Lab Results  Component Value Date   NA 134 (L) 10/10/2018   K 3.5 10/10/2018   CL 97 (L) 10/10/2018   CO2 25 10/10/2018   GLUCOSE 154 (H) 10/10/2018   BUN 36 (H) 10/10/2018   CREATININE 1.03 10/10/2018   CALCIUM 9.1 10/10/2018   PROT 8.3 (H) 09/30/2018   ALBUMIN 4.4 09/30/2018   AST 21 09/30/2018   ALT 33 09/30/2018   ALKPHOS 91 09/30/2018   BILITOT 1.1 09/30/2018   GFRNONAA >60 10/10/2018   GFRAA >60 10/10/2018    Lab Results  Component Value Date   WBC 3.7 (L) 10/10/2018   NEUTROABS 2.2 10/10/2018   HGB 12.0 (L) 10/10/2018   HCT 34.5 (L) 10/10/2018   MCV 86.9 10/10/2018   PLT 239 10/10/2018     STUDIES: No results found.  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  PET scan results from August 06, 2018 reviewed independently with no obvious hypermetabolic activity in positive lymph node or other area to suggest a primary.  Continue daily XRT completing on October 16, 2018.  Given patient's persistent pancytopenia, weight loss, and dysphasia cisplatin has been discontinued.  Patient received his fifth  and final infusion on Sep 20, 2018.  Return to clinic in 2 weeks for repeat laboratory work and further evaluation. 2.  Thrombocytopenia: Resolved. 3.  Neutropenia: Resolved. 4.  Dysphasia: Improving.  Secondary to XRT.  Continue Carafate and Magic mouthwash as prescribed. 5.  Weight loss: Previously placement of a feeding tube was discussed, but patient declined.  Continue to monitor closely.   6.  Anxiety: Continue Xanax as needed. 7.  Hyponatremia: Resolved.  Patient expressed understanding and was in agreement with this plan. He also understands that He can call clinic at any time with any questions, concerns, or complaints.   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

## 2018-10-28 ENCOUNTER — Inpatient Hospital Stay: Payer: BC Managed Care – PPO | Admitting: Oncology

## 2018-10-28 ENCOUNTER — Inpatient Hospital Stay: Payer: BC Managed Care – PPO

## 2018-10-29 ENCOUNTER — Other Ambulatory Visit: Payer: Self-pay

## 2018-10-29 ENCOUNTER — Inpatient Hospital Stay (HOSPITAL_BASED_OUTPATIENT_CLINIC_OR_DEPARTMENT_OTHER): Payer: BC Managed Care – PPO | Admitting: Oncology

## 2018-10-29 ENCOUNTER — Inpatient Hospital Stay: Payer: BC Managed Care – PPO | Attending: Oncology

## 2018-10-29 ENCOUNTER — Encounter: Payer: Self-pay | Admitting: Oncology

## 2018-10-29 VITALS — BP 116/73 | HR 60 | Temp 96.3°F | Ht 72.0 in | Wt 188.1 lb

## 2018-10-29 DIAGNOSIS — R1310 Dysphagia, unspecified: Secondary | ICD-10-CM | POA: Insufficient documentation

## 2018-10-29 DIAGNOSIS — C779 Secondary and unspecified malignant neoplasm of lymph node, unspecified: Secondary | ICD-10-CM | POA: Diagnosis not present

## 2018-10-29 DIAGNOSIS — Z923 Personal history of irradiation: Secondary | ICD-10-CM | POA: Insufficient documentation

## 2018-10-29 DIAGNOSIS — R531 Weakness: Secondary | ICD-10-CM | POA: Diagnosis not present

## 2018-10-29 DIAGNOSIS — R4702 Dysphasia: Secondary | ICD-10-CM | POA: Diagnosis not present

## 2018-10-29 DIAGNOSIS — D649 Anemia, unspecified: Secondary | ICD-10-CM | POA: Diagnosis not present

## 2018-10-29 DIAGNOSIS — D61818 Other pancytopenia: Secondary | ICD-10-CM

## 2018-10-29 DIAGNOSIS — R634 Abnormal weight loss: Secondary | ICD-10-CM | POA: Diagnosis not present

## 2018-10-29 DIAGNOSIS — C770 Secondary and unspecified malignant neoplasm of lymph nodes of head, face and neck: Secondary | ICD-10-CM | POA: Diagnosis not present

## 2018-10-29 DIAGNOSIS — E871 Hypo-osmolality and hyponatremia: Secondary | ICD-10-CM | POA: Insufficient documentation

## 2018-10-29 DIAGNOSIS — Z8249 Family history of ischemic heart disease and other diseases of the circulatory system: Secondary | ICD-10-CM | POA: Diagnosis not present

## 2018-10-29 DIAGNOSIS — R5383 Other fatigue: Secondary | ICD-10-CM | POA: Diagnosis not present

## 2018-10-29 DIAGNOSIS — Z79899 Other long term (current) drug therapy: Secondary | ICD-10-CM | POA: Diagnosis not present

## 2018-10-29 DIAGNOSIS — F419 Anxiety disorder, unspecified: Secondary | ICD-10-CM | POA: Diagnosis not present

## 2018-10-29 LAB — CBC WITH DIFFERENTIAL/PLATELET
Abs Immature Granulocytes: 0.15 10*3/uL — ABNORMAL HIGH (ref 0.00–0.07)
Basophils Absolute: 0 10*3/uL (ref 0.0–0.1)
Basophils Relative: 0 %
Eosinophils Absolute: 0 10*3/uL (ref 0.0–0.5)
Eosinophils Relative: 0 %
HCT: 30.6 % — ABNORMAL LOW (ref 39.0–52.0)
Hemoglobin: 10.2 g/dL — ABNORMAL LOW (ref 13.0–17.0)
Immature Granulocytes: 2 %
Lymphocytes Relative: 5 %
Lymphs Abs: 0.4 10*3/uL — ABNORMAL LOW (ref 0.7–4.0)
MCH: 30.6 pg (ref 26.0–34.0)
MCHC: 33.3 g/dL (ref 30.0–36.0)
MCV: 91.9 fL (ref 80.0–100.0)
Monocytes Absolute: 0.8 10*3/uL (ref 0.1–1.0)
Monocytes Relative: 10 %
Neutro Abs: 6.5 10*3/uL (ref 1.7–7.7)
Neutrophils Relative %: 83 %
Platelets: 154 10*3/uL (ref 150–400)
RBC: 3.33 MIL/uL — ABNORMAL LOW (ref 4.22–5.81)
RDW: 18.4 % — ABNORMAL HIGH (ref 11.5–15.5)
WBC: 7.9 10*3/uL (ref 4.0–10.5)
nRBC: 0.3 % — ABNORMAL HIGH (ref 0.0–0.2)

## 2018-10-29 LAB — BASIC METABOLIC PANEL
Anion gap: 9 (ref 5–15)
BUN: 21 mg/dL — ABNORMAL HIGH (ref 6–20)
CO2: 27 mmol/L (ref 22–32)
Calcium: 9.3 mg/dL (ref 8.9–10.3)
Chloride: 98 mmol/L (ref 98–111)
Creatinine, Ser: 0.99 mg/dL (ref 0.61–1.24)
GFR calc Af Amer: >60 mL/min (ref >60)
GFR calc non Af Amer: >60 mL/min (ref >60)
Glucose, Bld: 97 mg/dL (ref 70–99)
Potassium: 4.8 mmol/L (ref 3.5–5.1)
Sodium: 134 mmol/L — ABNORMAL LOW (ref 135–145)

## 2018-10-29 NOTE — Progress Notes (Signed)
Patient stated that he had been doing well with no complaints. Patient stated that he continues to drink his daily shakes but hoping that in a week or two he will start eating food.

## 2018-11-01 NOTE — Progress Notes (Signed)
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  He continues to have weakness and fatigue, but this is improving.  His appetite is also slowly improving and he has no further weight loss. He continues to have dysphagia. He has no neurologic complaints. He denies any recent fevers or illnesses. He has no chest pain, shortness of breath, cough, or hemoptysis.  He denies any nausea, vomiting, constipation, or diarrhea.  He has no urinary complaints.  Patient offers no further specific complaints today.  REVIEW OF SYSTEMS:   Review of Systems  Constitutional: Positive for malaise/fatigue. Negative for fever and weight loss.  HENT: Positive for sore throat.   Respiratory: Negative.  Negative for cough and hemoptysis.   Cardiovascular: Negative.  Negative for chest pain and leg swelling.  Gastrointestinal: Negative.  Negative for abdominal pain and nausea.  Genitourinary: Negative.  Negative for dysuria.  Musculoskeletal: Negative.  Negative for neck pain.  Skin: Negative.  Negative for rash.  Neurological: Positive for weakness. Negative for dizziness, speech change, focal weakness and headaches.  Endo/Heme/Allergies: Does not bruise/bleed easily.  Psychiatric/Behavioral: Negative.  The patient is not nervous/anxious.     As per HPI. Otherwise, a complete review of systems is negative.  PAST MEDICAL HISTORY: History reviewed. No pertinent past medical history.  PAST SURGICAL HISTORY: Past Surgical History:  Procedure Laterality Date  . APPENDECTOMY    . THROAT SURGERY Right     FAMILY HISTORY: Family History  Problem Relation Age of Onset   . Aortic aneurysm Father     ADVANCED DIRECTIVES (Y/N):  N  HEALTH MAINTENANCE: Social History   Tobacco Use  . Smoking status: Never Smoker  . Smokeless tobacco: Current User    Types: Chew  Substance Use Topics  . Alcohol use: Yes    Alcohol/week: 0.0 standard drinks    Comment: occasional  . Drug use: No     Colonoscopy:  PAP:  Bone density:  Lipid panel:  No Known Allergies  Current Outpatient Medications  Medication Sig Dispense Refill  . acetaminophen-codeine 120-12 MG/5ML solution Take 12.5 mLs by mouth every 4 (four) hours as needed for moderate pain. 480 mL 0  . ALPRAZolam (XANAX) 0.25 MG tablet Take 1 tablet (0.25 mg total) by mouth daily as needed for anxiety. 15 tablet 0  . amoxicillin (AMOXIL) 500 MG capsule Take 1 capsule by mouth 4 (four) times daily.    Marland Kitchen dexamethasone (DECADRON) 4 MG tablet Take 1 tablet (4 mg total) by mouth 2 (two) times daily. 50 tablet 0  . doxepin (SINEQUAN) 10 MG/ML solution SWISH AND SPIT 5 ML 3 6 TIMES A DAY AS DIRECTED    . silver sulfADIAZINE (SILVADENE) 1 % cream Apply 1 application topically 2 (two) times daily. 50 g 2  . sucralfate (CARAFATE) 1 g tablet Take 1 tablet (1 g total) by mouth 3 (three) times daily before meals. Dissolve in warm water, swish and swallow 90 tablet 6  . Multiple Vitamin (MULTI-VITAMIN) tablet Take 1 tablet by mouth 1 day or 1 dose.    . ondansetron (ZOFRAN) 8 MG tablet Take 1 tablet (8 mg total) by mouth 2 (two) times daily as needed. Start on  the third day after chemotherapy. (Patient not taking: Reported on 10/29/2018) 30 tablet 1  . prochlorperazine (COMPAZINE) 10 MG tablet Take 1 tablet (10 mg total) by mouth every 6 (six) hours as needed (Nausea or vomiting). (Patient not taking: Reported on 10/29/2018) 60 tablet 1  . promethazine (PHENERGAN) 25 MG tablet Take 1 tablet (25 mg total) by mouth every 6 (six) hours as needed for nausea or vomiting. (Patient not taking: Reported on 10/29/2018) 30 tablet 0   No  current facility-administered medications for this visit.     OBJECTIVE: Vitals:   10/29/18 1012  BP: 116/73  Pulse: 60  Temp: (!) 96.3 F (35.7 C)     Body mass index is 25.51 kg/m.    ECOG FS:0 - Asymptomatic  General: Well-developed, well-nourished, no acute distress. Eyes: Pink conjunctiva, anicteric sclera. HEENT: Normocephalic, moist mucous membranes, clear oropharnyx.  No palpable lymphadenopathy. Lungs: Clear to auscultation bilaterally. Heart: Regular rate and rhythm. No rubs, murmurs, or gallops. Abdomen: Soft, nontender, nondistended. No organomegaly noted, normoactive bowel sounds. Musculoskeletal: No edema, cyanosis, or clubbing. Neuro: Alert, answering all questions appropriately. Cranial nerves grossly intact. Skin: No rashes or petechiae noted. Psych: Normal affect.  LAB RESULTS:  Lab Results  Component Value Date   NA 134 (L) 10/29/2018   K 4.8 10/29/2018   CL 98 10/29/2018   CO2 27 10/29/2018   GLUCOSE 97 10/29/2018   BUN 21 (H) 10/29/2018   CREATININE 0.99 10/29/2018   CALCIUM 9.3 10/29/2018   PROT 8.3 (H) 09/30/2018   ALBUMIN 4.4 09/30/2018   AST 21 09/30/2018   ALT 33 09/30/2018   ALKPHOS 91 09/30/2018   BILITOT 1.1 09/30/2018   GFRNONAA >60 10/29/2018   GFRAA >60 10/29/2018    Lab Results  Component Value Date   WBC 7.9 10/29/2018   NEUTROABS 6.5 10/29/2018   HGB 10.2 (L) 10/29/2018   HCT 30.6 (L) 10/29/2018   MCV 91.9 10/29/2018   PLT 154 10/29/2018     STUDIES: No results found.  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  PET scan results from August 06, 2018 reviewed independently with no obvious hypermetabolic activity in positive lymph node or other area to suggest a primary.  Patient completed XRT on October 16, 2018.  Given patient's persistent  pancytopenia, weight loss, and dysphasia cisplatin was discontinued early and he received his fifth and final infusion on Sep 20, 2018.  Return to clinic in 2 months with repeat imaging using PET scan and further evaluation. 2.  Dysphasia: Improving.  Secondary to XRT.  Continue Carafate and Magic mouthwash as prescribed. 3.  Weight loss: Patient's weight is now stabilized.  Monitor. 4.  Anxiety: Continue Xanax as needed. 5.  Hyponatremia: Mild. 6.  Anemia: Patient's hemoglobin is trended down to 10.2, monitor.  Patient expressed understanding and was in agreement with this plan. He also understands that He can call clinic at any time with any questions, concerns, or complaints.   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

## 2018-11-07 ENCOUNTER — Other Ambulatory Visit: Payer: Self-pay | Admitting: Oncology

## 2018-11-07 DIAGNOSIS — C779 Secondary and unspecified malignant neoplasm of lymph node, unspecified: Secondary | ICD-10-CM

## 2018-11-27 ENCOUNTER — Ambulatory Visit
Admission: RE | Admit: 2018-11-27 | Discharge: 2018-11-27 | Disposition: A | Discharge status: Home / Self Care | Payer: BC Managed Care – PPO | Source: Ambulatory Visit | Attending: Radiation Oncology | Admitting: Radiation Oncology

## 2018-11-27 ENCOUNTER — Encounter: Payer: Self-pay | Admitting: Radiation Oncology

## 2018-11-27 ENCOUNTER — Other Ambulatory Visit: Payer: Self-pay | Admitting: *Deleted

## 2018-11-27 ENCOUNTER — Other Ambulatory Visit: Payer: Self-pay

## 2018-11-27 VITALS — BP 129/85 | HR 67 | Temp 96.6°F | Resp 16 | Wt 196.5 lb

## 2018-11-27 DIAGNOSIS — C760 Malignant neoplasm of head, face and neck: Secondary | ICD-10-CM

## 2018-11-27 DIAGNOSIS — C7989 Secondary malignant neoplasm of other specified sites: Secondary | ICD-10-CM | POA: Diagnosis present

## 2018-11-27 DIAGNOSIS — B370 Candidal stomatitis: Secondary | ICD-10-CM | POA: Diagnosis not present

## 2018-11-27 DIAGNOSIS — Z923 Personal history of irradiation: Secondary | ICD-10-CM | POA: Insufficient documentation

## 2018-11-27 DIAGNOSIS — C801 Malignant (primary) neoplasm, unspecified: Secondary | ICD-10-CM | POA: Diagnosis not present

## 2018-11-27 MED ORDER — FLUCONAZOLE 100 MG PO TABS: 100.0000 mg | ORAL_TABLET | Freq: Every day | ORAL | 0 refills | Status: DC

## 2018-11-27 NOTE — Progress Notes (Signed)
Radiation Oncology Follow up Note  Name: Dustin Hall   Date:   11/27/2018 MRN:  [id number] DOB: 05-25-1960    This 58 y.o. male presents to the clinic today for 1 month follow-up status post concurrent chemoradiation therapy for stage.  2 (TX N1 M0) squamous cell carcinoma of unknown primary of the head and neck.  REFERRING PROVIDER: Jodelle Green, FNP  HPI: Patient is a 58 year old male now at 1 month having completed radiation therapy for head and neck cancer of unknown primary.  Seen today in routine follow-up still is having some slight dysphasia actually he is putting on weight.  His taste is still altered.  He states his mouth is dry.  He specifically denies head and neck pain..  COMPLICATIONS OF TREATMENT: none  FOLLOW UP COMPLIANCE: keeps appointments   PHYSICAL EXAM:  BP 129/85 (BP Location: Left Arm, Patient Position: Sitting)   Pulse 67   Temp (!) 96.6 F (35.9 C) (Tympanic)   Resp 16   Wt 196 lb 8 oz (89.1 kg)   BMI 26.65 kg/m  Oral cavity is has some oral candidiasis.  No oral mucosal lesions are identified.  Neck is clear without evidence of cervical or supraclavicular adenopathy.  Well-developed well-nourished patient in NAD. HEENT reveals PERLA, EOMI, discs not visualized.  Oral cavity is clear. No oral mucosal lesions are identified. Neck is clear without evidence of cervical or supraclavicular adenopathy. Lungs are clear to A&P. Cardiac examination is essentially unremarkable with regular rate and rhythm without murmur rub or thrill. Abdomen is benign with no organomegaly or masses noted. Motor sensory and DTR levels are equal and symmetric in the upper and lower extremities. Cranial nerves II through XII are grossly intact. Proprioception is intact. No peripheral adenopathy or edema is identified. No motor or sensory levels are noted. Crude visual fields are within normal range. RADIOLOGY RESULTS: No current films for review  PLAN: Present time he is doing well I am  starting him on a 2-week course of Diflucan for oral candidiasis.  I have asked to see him back in 3 months for follow-up and he already has a PET CT scan ordered.  I have asked him to continue close follow-up care with Dr. Tami Ribas.  Patient knows to call with any concerns.  I would like to take this opportunity to thank you for allowing me to participate in the care of your patient.Noreene Filbert, MD

## 2018-11-28 ENCOUNTER — Telehealth: Payer: Self-pay

## 2018-11-28 NOTE — Telephone Encounter (Signed)
Nutrition  Called for nutrition follow-up.  No answer.  Left message on voicemail with call back number.  Derra Shartzer B. Aiyla Baucom, RD, LDN Registered Dietitian [phone] (pager)   

## 2018-11-29 ENCOUNTER — Other Ambulatory Visit: Payer: Self-pay | Admitting: *Deleted

## 2018-11-29 MED ORDER — ACETAMINOPHEN-CODEINE 120-12 MG/5ML PO SOLN: 12.5000 mL | ORAL | 0 refills | Status: DC | PRN

## 2018-11-29 NOTE — Telephone Encounter (Signed)
Dustin Hall informed of refill

## 2018-12-24 DIAGNOSIS — I2510 Atherosclerotic heart disease of native coronary artery without angina pectoris: Secondary | ICD-10-CM

## 2018-12-24 HISTORY — DX: Atherosclerotic heart disease of native coronary artery without angina pectoris: I25.10

## 2018-12-27 NOTE — Progress Notes (Signed)
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  He continues to have a poor appetite and dry mouth.  He no longer has any pain or dysphasia.  His weight has remained stable. He has no neurologic complaints. He denies any recent fevers or illnesses. He has no chest pain, shortness of breath, cough, or hemoptysis.  He denies any nausea, vomiting, constipation, or diarrhea.  He has no urinary complaints.  Patient offers no further specific complaints today.  REVIEW OF SYSTEMS:   Review of Systems  Constitutional: Negative.  Negative for fever, malaise/fatigue and weight loss.  HENT: Negative.  Negative for sore throat.   Respiratory: Negative.  Negative for cough and hemoptysis.   Cardiovascular: Negative.  Negative for chest pain and leg swelling.  Gastrointestinal: Negative.  Negative for abdominal pain and nausea.  Genitourinary: Negative.  Negative for dysuria.  Musculoskeletal: Negative.  Negative for neck pain.  Skin: Negative.  Negative for rash.  Neurological: Negative.  Negative for dizziness, speech change, focal weakness, weakness and headaches.  Endo/Heme/Allergies: Does not bruise/bleed easily.  Psychiatric/Behavioral: Negative.  The patient is not nervous/anxious.     As per HPI. Otherwise, a complete review of systems is negative.  PAST MEDICAL HISTORY: History reviewed. No pertinent past medical history.  PAST SURGICAL HISTORY: Past Surgical History:  Procedure Laterality Date  . APPENDECTOMY    . THROAT SURGERY Right     FAMILY HISTORY: Family History  Problem Relation Age of Onset   . Aortic aneurysm Father     ADVANCED DIRECTIVES (Y/N):  N  HEALTH MAINTENANCE: Social History   Tobacco Use  . Smoking status: Never Smoker  . Smokeless tobacco: Current User    Types: Chew  Substance Use Topics  . Alcohol use: Yes    Alcohol/week: 0.0 standard drinks    Comment: occasional  . Drug use: No     Colonoscopy:  PAP:  Bone density:  Lipid panel:  No Known Allergies  Current Outpatient Medications  Medication Sig Dispense Refill  . acetaminophen-codeine 120-12 MG/5ML solution Take 12.5 mLs by mouth every 4 (four) hours as needed for moderate pain. 480 mL 0  . ALPRAZolam (XANAX) 0.25 MG tablet Take 1 tablet (0.25 mg total) by mouth daily as needed for anxiety. 15 tablet 0  . doxepin (SINEQUAN) 10 MG/ML solution SWISH AND SPIT 5 ML 3 6 TIMES A DAY AS DIRECTED    . Multiple Vitamin (MULTI-VITAMIN) tablet Take 1 tablet by mouth 1 day or 1 dose.    . ondansetron (ZOFRAN) 8 MG tablet TAKE 1 TABLET BY MOUTH 2 (TWO) TIMES DAILY AS NEEDED. START ON THE THIRD DAY AFTER CHEMOTHERAPY. 30 tablet 1  . prochlorperazine (COMPAZINE) 10 MG tablet Take 1 tablet (10 mg total) by mouth every 6 (six) hours as needed (Nausea or vomiting). 60 tablet 1  . promethazine (PHENERGAN) 25 MG tablet Take 1 tablet (25 mg total) by mouth every 6 (six) hours as needed for nausea or vomiting. 30 tablet 0  . silver sulfADIAZINE (SILVADENE) 1 % cream Apply 1 application topically 2 (two) times daily. 50 g 2  . sucralfate (CARAFATE) 1 g tablet Take 1  tablet (1 g total) by mouth 3 (three) times daily before meals. Dissolve in warm water, swish and swallow 90 tablet 6   No current facility-administered medications for this visit.     OBJECTIVE: Vitals:   01/07/19 1421  BP: 119/79  Pulse: 70  Temp: 97.7 F (36.5 C)     Body mass index is 26.31 kg/m.    ECOG FS:0 - Asymptomatic  General: Well-developed, well-nourished, no acute distress. Eyes: Pink conjunctiva, anicteric sclera. HEENT:  Normocephalic, moist mucous membranes, clear oropharnyx.  No palpable lymphadenopathy. Lungs: Clear to auscultation bilaterally. Heart: Regular rate and rhythm. No rubs, murmurs, or gallops. Abdomen: Soft, nontender, nondistended. No organomegaly noted, normoactive bowel sounds. Musculoskeletal: No edema, cyanosis, or clubbing. Neuro: Alert, answering all questions appropriately. Cranial nerves grossly intact. Skin: No rashes or petechiae noted. Psych: Normal affect.  LAB RESULTS:  Lab Results  Component Value Date   NA 137 01/02/2019   K 4.4 01/02/2019   CL 103 01/02/2019   CO2 25 01/02/2019   GLUCOSE 107 (H) 01/02/2019   BUN 21 (H) 01/02/2019   CREATININE 0.98 01/02/2019   CALCIUM 9.2 01/02/2019   PROT 8.3 (H) 09/30/2018   ALBUMIN 4.4 09/30/2018   AST 21 09/30/2018   ALT 33 09/30/2018   ALKPHOS 91 09/30/2018   BILITOT 1.1 09/30/2018   GFRNONAA >60 01/02/2019   GFRAA >60 01/02/2019    Lab Results  Component Value Date   WBC 3.4 (L) 01/02/2019   NEUTROABS 2.6 01/02/2019   HGB 10.9 (L) 01/02/2019   HCT 33.3 (L) 01/02/2019   MCV 94.9 01/02/2019   PLT 136 (L) 01/02/2019     STUDIES: Nm Pet Image Restag (ps) Skull Base To Thigh  Result Date: 01/02/2019 CLINICAL DATA:  Subsequent treatment strategy for metastatic squamous cell carcinoma. EXAM: NUCLEAR MEDICINE PET SKULL BASE TO THIGH TECHNIQUE: 9.8 mCi F-18 FDG was injected intravenously. Full-ring PET imaging was performed from the skull base to thigh after the radiotracer. CT data was obtained and used for attenuation correction and anatomic localization. Fasting blood glucose: 97 mg/dl COMPARISON:  08/06/2018 FINDINGS: Mediastinal blood pool activity: SUV max 2.7 Liver activity: SUV max 3.9 NECK: Previous palatine tonsillar activity has essentially resolved. No abnormal activity in the neck is observed. Incidental CT findings: None CHEST: The lungs appear clear. Cardiac and mediastinal contours normal. No pleural effusion  identified. Incidental CT findings: Left anterior descending and right coronary artery atherosclerotic calcification. ABDOMEN/PELVIS: No significant abnormal hypermetabolic activity in this region. Incidental CT findings: There are few scattered sigmoid colon diverticula. SKELETON: No significant abnormal hypermetabolic activity in this region. Incidental CT findings: none IMPRESSION: 1. No findings of active malignancy identified. 2. Coronary atherosclerosis. Electronically Signed   By: Van Clines M.D.   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  Patient completed XRT on October 16, 2018.  Given patient's persistent pancytopenia, weight loss, and dysphasia cisplatin was discontinued early and he received his fifth and final infusion on Sep 20, 2018.  PET scan results from January 02, 2019 reviewed independently and report as above with no obvious evidence of persistent or recurrent disease.  No intervention is needed at this time.  Patient has been instructed to follow-up with ENT as scheduled.  Return to clinic in  3 months for routine evaluation.  Will consider repeat imaging with CT scan in 6 months.  2.  Dysphasia: Resolved. 3.  Weight loss: Patient's weight remains stable. 4.  Anxiety: Continue Xanax as needed. 5.  Hyponatremia: Resolved. 6.  Pancytopenia: Mild, monitor. 7.  Dry mouth: Secondary to XRT.  Continue symptomatic treatment as directed.  Patient expressed understanding and was in agreement with this plan. He also understands that He can call clinic at any time with any questions, concerns, or complaints.   Cancer Staging 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 [facility]) Staging form: Oral Cavity, AJCC 8th Edition - Clinical stage from  08/02/2018: Stage Unknown (cTX, cN2a, cM0) - Signed by Lloyd Huger, MD on 08/02/2018   Lloyd Huger, MD   01/08/2019 6:48 AM

## 2019-01-02 ENCOUNTER — Other Ambulatory Visit: Payer: Self-pay

## 2019-01-02 ENCOUNTER — Inpatient Hospital Stay: Payer: BC Managed Care – PPO | Attending: Oncology

## 2019-01-02 ENCOUNTER — Ambulatory Visit
Admission: RE | Admit: 2019-01-02 | Discharge: 2019-01-02 | Disposition: A | Discharge status: Home / Self Care | Payer: BC Managed Care – PPO | Source: Ambulatory Visit | Attending: Oncology | Admitting: Oncology

## 2019-01-02 DIAGNOSIS — R682 Dry mouth, unspecified: Secondary | ICD-10-CM | POA: Diagnosis not present

## 2019-01-02 DIAGNOSIS — C779 Secondary and unspecified malignant neoplasm of lymph node, unspecified: Secondary | ICD-10-CM

## 2019-01-02 DIAGNOSIS — C760 Malignant neoplasm of head, face and neck: Secondary | ICD-10-CM | POA: Diagnosis not present

## 2019-01-02 DIAGNOSIS — K5730 Diverticulosis of large intestine without perforation or abscess without bleeding: Secondary | ICD-10-CM | POA: Insufficient documentation

## 2019-01-02 DIAGNOSIS — F419 Anxiety disorder, unspecified: Secondary | ICD-10-CM | POA: Diagnosis not present

## 2019-01-02 DIAGNOSIS — I2510 Atherosclerotic heart disease of native coronary artery without angina pectoris: Secondary | ICD-10-CM | POA: Diagnosis not present

## 2019-01-02 DIAGNOSIS — D61818 Other pancytopenia: Secondary | ICD-10-CM | POA: Insufficient documentation

## 2019-01-02 DIAGNOSIS — Z923 Personal history of irradiation: Secondary | ICD-10-CM | POA: Diagnosis not present

## 2019-01-02 DIAGNOSIS — C801 Malignant (primary) neoplasm, unspecified: Secondary | ICD-10-CM | POA: Insufficient documentation

## 2019-01-02 DIAGNOSIS — F17220 Nicotine dependence, chewing tobacco, uncomplicated: Secondary | ICD-10-CM | POA: Insufficient documentation

## 2019-01-02 DIAGNOSIS — R634 Abnormal weight loss: Secondary | ICD-10-CM | POA: Diagnosis not present

## 2019-01-02 DIAGNOSIS — R630 Anorexia: Secondary | ICD-10-CM | POA: Diagnosis not present

## 2019-01-02 DIAGNOSIS — Z79899 Other long term (current) drug therapy: Secondary | ICD-10-CM | POA: Diagnosis not present

## 2019-01-02 DIAGNOSIS — Z8249 Family history of ischemic heart disease and other diseases of the circulatory system: Secondary | ICD-10-CM | POA: Diagnosis not present

## 2019-01-02 DIAGNOSIS — Z6826 Body mass index (BMI) 26.0-26.9, adult: Secondary | ICD-10-CM | POA: Insufficient documentation

## 2019-01-02 LAB — CBC WITH DIFFERENTIAL/PLATELET
Abs Immature Granulocytes: 0.01 10*3/uL (ref 0.00–0.07)
Basophils Absolute: 0 10*3/uL (ref 0.0–0.1)
Basophils Relative: 1 %
Eosinophils Absolute: 0.1 10*3/uL (ref 0.0–0.5)
Eosinophils Relative: 2 %
HCT: 33.3 % — ABNORMAL LOW (ref 39.0–52.0)
Hemoglobin: 10.9 g/dL — ABNORMAL LOW (ref 13.0–17.0)
Immature Granulocytes: 0 %
Lymphocytes Relative: 12 %
Lymphs Abs: 0.4 10*3/uL — ABNORMAL LOW (ref 0.7–4.0)
MCH: 31.1 pg (ref 26.0–34.0)
MCHC: 32.7 g/dL (ref 30.0–36.0)
MCV: 94.9 fL (ref 80.0–100.0)
Monocytes Absolute: 0.3 10*3/uL (ref 0.1–1.0)
Monocytes Relative: 8 %
Neutro Abs: 2.6 10*3/uL (ref 1.7–7.7)
Neutrophils Relative %: 77 %
Platelets: 136 10*3/uL — ABNORMAL LOW (ref 150–400)
RBC: 3.51 MIL/uL — ABNORMAL LOW (ref 4.22–5.81)
RDW: 12.7 % (ref 11.5–15.5)
WBC: 3.4 10*3/uL — ABNORMAL LOW (ref 4.0–10.5)
nRBC: 0 % (ref 0.0–0.2)

## 2019-01-02 LAB — BASIC METABOLIC PANEL
Anion gap: 9 (ref 5–15)
BUN: 21 mg/dL — ABNORMAL HIGH (ref 6–20)
CO2: 25 mmol/L (ref 22–32)
Calcium: 9.2 mg/dL (ref 8.9–10.3)
Chloride: 103 mmol/L (ref 98–111)
Creatinine, Ser: 0.98 mg/dL (ref 0.61–1.24)
GFR calc Af Amer: >60 mL/min (ref >60)
GFR calc non Af Amer: >60 mL/min (ref >60)
Glucose, Bld: 107 mg/dL — ABNORMAL HIGH (ref 70–99)
Potassium: 4.4 mmol/L (ref 3.5–5.1)
Sodium: 137 mmol/L (ref 135–145)

## 2019-01-02 LAB — GLUCOSE, CAPILLARY: Glucose-Capillary: 97 mg/dL (ref 70–99)

## 2019-01-02 MED ORDER — FLUDEOXYGLUCOSE F - 18 (FDG) INJECTION
9.7500 | Freq: Once | INTRAVENOUS | Status: AC | PRN
  Administered 2019-01-02: 10:00:00 9.75 via INTRAVENOUS

## 2019-01-07 ENCOUNTER — Encounter: Payer: Self-pay | Admitting: Oncology

## 2019-01-07 ENCOUNTER — Other Ambulatory Visit: Payer: Self-pay

## 2019-01-07 ENCOUNTER — Inpatient Hospital Stay (HOSPITAL_BASED_OUTPATIENT_CLINIC_OR_DEPARTMENT_OTHER): Payer: BC Managed Care – PPO | Admitting: Oncology

## 2019-01-07 VITALS — BP 119/79 | HR 70 | Temp 97.7°F | Wt 194.0 lb

## 2019-01-07 DIAGNOSIS — C779 Secondary and unspecified malignant neoplasm of lymph node, unspecified: Secondary | ICD-10-CM

## 2019-01-07 DIAGNOSIS — C760 Malignant neoplasm of head, face and neck: Secondary | ICD-10-CM | POA: Diagnosis not present

## 2019-01-07 DIAGNOSIS — C801 Malignant (primary) neoplasm, unspecified: Secondary | ICD-10-CM

## 2019-01-07 NOTE — Progress Notes (Signed)
Patient here today for follow up.  Patient c/o dry mouth and unable to taste.

## 2019-01-23 ENCOUNTER — Encounter: Payer: Self-pay | Admitting: *Deleted

## 2019-01-24 ENCOUNTER — Encounter: Payer: Self-pay | Admitting: Cardiology

## 2019-01-24 ENCOUNTER — Ambulatory Visit (INDEPENDENT_AMBULATORY_CARE_PROVIDER_SITE_OTHER): Payer: BC Managed Care – PPO | Admitting: Cardiology

## 2019-01-24 ENCOUNTER — Other Ambulatory Visit: Payer: Self-pay

## 2019-01-24 VITALS — BP 120/80 | HR 65 | Ht 72.0 in | Wt 190.0 lb

## 2019-01-24 DIAGNOSIS — I2510 Atherosclerotic heart disease of native coronary artery without angina pectoris: Secondary | ICD-10-CM | POA: Diagnosis not present

## 2019-01-24 MED ORDER — ASPIRIN EC 81 MG PO TBEC: 81.0000 mg | DELAYED_RELEASE_TABLET | Freq: Every day | ORAL | 3 refills | Status: AC

## 2019-01-24 MED ORDER — ATORVASTATIN CALCIUM 40 MG PO TABS: 40.0000 mg | ORAL_TABLET | Freq: Every day | ORAL | 3 refills | Status: DC

## 2019-01-24 NOTE — Progress Notes (Addendum)
Cardiology Office Note:    Date:  01/24/2019   ID:  Dustin Hall, DOB November 27, 1960, MRN [id number]  PCP:  Jodelle Green, FNP  Cardiologist:  Kate Sable, MD  Electrophysiologist:  None   Referring MD: Jodelle Green, FNP   Chief Complaint  Patient presents with   New Patient (Initial Visit)    Cardiac evaluation-coronary artery calcification    History of Present Illness:    Dustin Hall is a 58 y.o. male with a hx of squamous cell carcinoma of the neck, who presents due to coronary artery calcification.  Patient is a former snuff user, states using snuff for about 40 years.  He was diagnosed with a neck mass, found to be squamous cell cancer of the head and neck of unknown primary.  He underwent radiation and chemo, last chemo treatment was Sep 20, 2018.  Patient has been follow-up with PET scans, which showed no persistent or recurrent disease.  Last scan in January 02, 2019 was noted to have incidental findings of atherosclerotic calcifications in the left anterior descending and right coronary arteries.  Patient states he feels fine, denies any chest pain, shortness of breath, palpitations, edema, orthopnea, syncope.  He is able to do all activities of daily living with no symptoms whatsoever.  Denies any family history of heart disease.  Denies having any history of diabetes, hypertension.  He does not remember his last cholesterol levels but states his last check was over 3 years ago.  Past Medical History:  Diagnosis Date   Cancer [facility])    Coronary artery disease 12/2018   incidental finding: coronary calcum noted in LAD and RCA on NM PET scan    Past Surgical History:  Procedure Laterality Date   APPENDECTOMY     THROAT SURGERY Right     Current Medications: No outpatient medications have been marked as taking for the 01/24/19 encounter (Office Visit) with Kate Sable, MD.     Allergies:   Patient has no known allergies.   Social History    Socioeconomic History   Marital status: Married    Spouse name: Not on file   Number of children: Not on file   Years of education: Not on file   Highest education level: Not on file  Occupational History   Not on file  Social Needs   Financial resource strain: Not on file   Food insecurity    Worry: Not on file    Inability: Not on file   Transportation needs    Medical: Not on file    Non-medical: Not on file  Tobacco Use   Smoking status: Never Smoker   Smokeless tobacco: Current User    Types: Chew  Substance and Sexual Activity   Alcohol use: Yes    Alcohol/week: 0.0 standard drinks    Comment: occasional   Drug use: No   Sexual activity: Yes    Partners: Female  Lifestyle   Physical activity    Days per week: Not on file    Minutes per session: Not on file   Stress: Not on file  Relationships   Social connections    Talks on phone: Not on file    Gets together: Not on file    Attends religious service: Not on file    Active member of club or organization: Not on file    Attends meetings of clubs or organizations: Not on file    Relationship status: Not on file  Other  Topics Concern   Not on file  Social History Narrative   Not on file     Family History: The patient's family history includes Aortic aneurysm in his father.  ROS:   Please see the history of present illness.     All other systems reviewed and are negative.  EKGs/Labs/Other Studies Reviewed:    The following studies were reviewed today: NM PET 01/02/2019 Incidental CT findings: Left anterior descending and right coronary artery atherosclerotic calcification.  ABDOMEN/PELVIS: No significant abnormal hypermetabolic activity in this region.  Incidental CT findings: There are few scattered sigmoid colon diverticula.  SKELETON: No significant abnormal hypermetabolic activity in this region.  Incidental CT findings: none  IMPRESSION: 1. No findings of  active malignancy identified. 2. Coronary atherosclerosis.  EKG:  EKG is  ordered today.  The ekg ordered today demonstrates normal sinus rhythm, normal ECG.  Recent Labs: 09/30/2018: ALT 33; Magnesium 2.3 01/02/2019: BUN 21; Creatinine, Ser 0.98; Hemoglobin 10.9; Platelets 136; Potassium 4.4; Sodium 137  Recent Lipid Panel No results found for: CHOL, TRIG, HDL, CHOLHDL, VLDL, LDLCALC, LDLDIRECT  Physical Exam:    VS:  BP 120/80 (BP Location: Left Arm, Patient Position: Sitting, Cuff Size: Normal)    Pulse 65    Ht 6' (1.829 m)    Wt 190 lb (86.2 kg)    SpO2 98%    BMI 25.77 kg/m     Wt Readings from Last 3 Encounters:  01/24/19 190 lb (86.2 kg)  01/07/19 194 lb (88 kg)  11/27/18 196 lb 8 oz (89.1 kg)     GEN:  Well nourished, well developed in no acute distress HEENT: Normal NECK: No JVD; No carotid bruits LYMPHATICS: No lymphadenopathy CARDIAC: RRR, no murmurs, rubs, gallops RESPIRATORY:  Clear to auscultation without rales, wheezing or rhonchi  ABDOMEN: Soft, non-tender, non-distended MUSCULOSKELETAL:  No edema; No deformity  SKIN: Warm and dry NEUROLOGIC:  Alert and oriented x 3 PSYCHIATRIC:  Normal affect   ASSESSMENT:   Patient has incidental findings of coronary artery calcium involving the LAD and RCA.  He is asymptomatic.  No indication for further cardiac testing at this point.  His blood pressure is normal. 1. Coronary artery disease involving native heart without angina pectoris, unspecified vessel or lesion type    PLAN:    In order of problems listed above:  1. We will obtain fasting lipid panel for further risk stratification.  Start aspirin 81 mg daily, Lipitor 40 mg daily for primary prevention.  Follow-up in about 3 months.  Total encounter time more than 45 minutes  Greater than 50% was spent in counseling and coordination of care with the patient   Medication Adjustments/Labs and Tests Ordered: Current medicines are reviewed at length with the  patient today.  Concerns regarding medicines are outlined above.  Orders Placed This Encounter  Procedures   Lipid panel   LDL cholesterol, direct   EKG 12-Lead   Meds ordered this encounter  Medications   aspirin EC 81 MG tablet    Sig: Take 1 tablet (81 mg total) by mouth daily.    Dispense:  90 tablet    Refill:  3   atorvastatin (LIPITOR) 40 MG tablet    Sig: Take 1 tablet (40 mg total) by mouth daily.    Dispense:  90 tablet    Refill:  3    Patient Instructions  Medication Instructions:  1- START Aspirin Take 1 tablet (81 mg total) by mouth daily 2- START Lipitor Take  1 tablet (40 mg total) by mouth daily If you need a refill on your cardiac medications before your next appointment, please call your pharmacy.   Lab work: Your physician recommends that you have lab work today(Lipids)  If you have labs (blood work) drawn today and your tests are completely normal, you will receive your results only by:  MyChart Message (if you have MyChart) OR  A paper copy in the mail If you have any lab test that is abnormal or we need to change your treatment, we will call you to review the results.  Testing/Procedures: None ordered   Follow-Up: At [facility], you and your health needs are our priority.  As part of our continuing mission to provide you with exceptional heart care, we have created designated Provider Care Teams.  These Care Teams include your primary Cardiologist (physician) and Advanced Practice Providers (APPs -  Physician Assistants and Nurse Practitioners) who all work together to provide you with the care you need, when you need it. You will need a follow up appointment in 3 months. You may see Kate Sable, MD or one of the following Advanced Practice Providers on your designated Care Team:   Murray Hodgkins, NP Christell Faith, PA-C  Marrianne Mood, PA-C     Signed, Kate Sable, MD  01/24/2019 12:37 PM    Ensign

## 2019-01-24 NOTE — Patient Instructions (Signed)
Medication Instructions:  1- START Aspirin Take 1 tablet (81 mg total) by mouth daily 2- START Lipitor Take 1 tablet (40 mg total) by mouth daily If you need a refill on your cardiac medications before your next appointment, please call your pharmacy.   Lab work: Your physician recommends that you have lab work today(Lipids)  If you have labs (blood work) drawn today and your tests are completely normal, you will receive your results only by: Marland Kitchen MyChart Message (if you have MyChart) OR . A paper copy in the mail If you have any lab test that is abnormal or we need to change your treatment, we will call you to review the results.  Testing/Procedures: None ordered   Follow-Up: At [facility], you and your health needs are our priority.  As part of our continuing mission to provide you with exceptional heart care, we have created designated Provider Care Teams.  These Care Teams include your primary Cardiologist (physician) and Advanced Practice Providers (APPs -  Physician Assistants and Nurse Practitioners) who all work together to provide you with the care you need, when you need it. You will need a follow up appointment in 3 months. You may see Kate Sable, MD or one of the following Advanced Practice Providers on your designated Care Team:   Murray Hodgkins, NP Christell Faith, PA-C . Marrianne Mood, PA-C

## 2019-01-25 LAB — LIPID PANEL
Chol/HDL Ratio: 4 ratio (ref 0.0–5.0)
Cholesterol, Total: 167 mg/dL (ref 100–199)
HDL: 42 mg/dL (ref >39)
LDL Chol Calc (NIH): 107 mg/dL — ABNORMAL HIGH (ref 0–99)
Triglycerides: 97 mg/dL (ref 0–149)
VLDL Cholesterol Cal: 18 mg/dL (ref 5–40)

## 2019-01-25 LAB — LDL CHOLESTEROL, DIRECT: LDL Direct: 108 mg/dL — ABNORMAL HIGH (ref 0–99)

## 2019-01-27 ENCOUNTER — Other Ambulatory Visit: Payer: Self-pay | Admitting: *Deleted

## 2019-01-28 ENCOUNTER — Other Ambulatory Visit: Payer: Self-pay | Admitting: *Deleted

## 2019-01-28 MED ORDER — ACETAMINOPHEN-CODEINE 120-12 MG/5ML PO SOLN: 12.5000 mL | ORAL | 0 refills | Status: DC | PRN

## 2019-01-28 NOTE — Telephone Encounter (Signed)
[facility] does not have his medicine and she needs it sent to [place]

## 2019-01-28 NOTE — Telephone Encounter (Signed)
Wife called again this morning reporting that patient needs liquid Tylenol with codeine due to pain and difficulty swallowing pills post radiation therapy. She also wanted to thank [place] for everything stating he is doing a lot better than he was

## 2019-02-14 ENCOUNTER — Telehealth: Payer: Self-pay

## 2019-02-14 NOTE — Telephone Encounter (Signed)
Telephone call to patient and wife answered the phone.  Discussed SCP visit and wife in agreement to have SCP and treatment summary mailed and will call Tuesday Nov. 3rd to review material and discuss post cancer treatment.  Packet mailed

## 2019-02-25 ENCOUNTER — Inpatient Hospital Stay: Payer: BC Managed Care – PPO | Attending: Oncology | Admitting: Nurse Practitioner

## 2019-02-25 ENCOUNTER — Encounter: Payer: Self-pay | Admitting: Nurse Practitioner

## 2019-02-25 DIAGNOSIS — E785 Hyperlipidemia, unspecified: Secondary | ICD-10-CM

## 2019-02-25 DIAGNOSIS — Z7982 Long term (current) use of aspirin: Secondary | ICD-10-CM

## 2019-02-25 DIAGNOSIS — B370 Candidal stomatitis: Secondary | ICD-10-CM

## 2019-02-25 DIAGNOSIS — G893 Neoplasm related pain (acute) (chronic): Secondary | ICD-10-CM | POA: Diagnosis not present

## 2019-02-25 DIAGNOSIS — C801 Malignant (primary) neoplasm, unspecified: Secondary | ICD-10-CM | POA: Diagnosis not present

## 2019-02-25 DIAGNOSIS — R439 Unspecified disturbances of smell and taste: Secondary | ICD-10-CM

## 2019-02-25 DIAGNOSIS — R634 Abnormal weight loss: Secondary | ICD-10-CM | POA: Diagnosis not present

## 2019-02-25 DIAGNOSIS — C770 Secondary and unspecified malignant neoplasm of lymph nodes of head, face and neck: Secondary | ICD-10-CM | POA: Diagnosis not present

## 2019-02-25 DIAGNOSIS — R682 Dry mouth, unspecified: Secondary | ICD-10-CM

## 2019-02-25 DIAGNOSIS — C779 Secondary and unspecified malignant neoplasm of lymph node, unspecified: Secondary | ICD-10-CM

## 2019-02-25 DIAGNOSIS — K146 Glossodynia: Secondary | ICD-10-CM

## 2019-02-25 DIAGNOSIS — Z79899 Other long term (current) drug therapy: Secondary | ICD-10-CM

## 2019-02-25 MED ORDER — NYSTATIN 100000 UNIT/ML MT SUSP: 5.0000 mL | Freq: Four times a day (QID) | OROMUCOSAL | 1 refills | Status: DC

## 2019-02-25 NOTE — Progress Notes (Signed)
Virtual Visit Progress Note  Survivorship Clinic Consult note [facility]  Telephone:([phone] [phone]  Patient Care Team: Jodelle Green, FNP as PCP - General (Family Medicine) Kate Sable, MD as PCP - Cardiology (Cardiology) Beverly Gust, MD (Otolaryngology) Lloyd Huger, MD as Consulting Physician (Oncology) Noreene Filbert, MD as Referring Physician (Radiation Oncology)   Name of the patient: Dustin Hall  DD:[id number]  December 04, 1960   Date of visit: 02/25/19  CLINIC:  Survivorship  REASON FOR VISIT: Survivorship Care Plan visit & follow-up post-treatment for a recent history of squamous cell carcinoma of head and neck, unknown primary.  I connected with Dustin Hall on 02/25/19 at  2:15 PM EST by video enabled telemedicine visit and verified that I am speaking with the correct person using two identifiers.   I discussed the limitations, risks, security and privacy concerns of performing an evaluation and management service by telemedicine and the availability of in-person appointments. I also discussed with the patient that there may be a patient responsible charge related to this service. The patient expressed understanding and agreed to proceed.   Other persons participating in the visit and their role in the encounter: Magdalene Patricia, RN (Survivorship RN)  Patients location: home Providers location: clinic  Chief Complaint: Squamous cell carcinoma of the head and neck, unknown primary  BRIEF ONCOLOGIC HISTORY:  Patient initially presented as 58 year old male who had noticed an increasing mass in the left side of his neck.  Subsequent imaging and lymph node biopsy revealed squamous cell carcinoma.  Further evaluation by ENT did not reveal distinct primary source.  Pathology p16 positive.  Case was discussed at length with ENT and presented at tumor board.  PET scan on 08/06/2018 showed no obvious hypermetabolic activity and  positive lymph node or other area to suggest primary.  Recommendation for concurrent chemotherapy with weekly cisplatin and daily XRT.  He initiated radiation on 08/21/2018.  Radiation completed on 10/16/2018.  Initiated cisplatin on 08/23/2018.  Completed 5 cycles on 09/20/2018.  Treatment discontinued early due to persistent pancytopenia, weight loss, and dysphagia.  PET scan on 01/02/2019 showed no findings of active malignancy.   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 [facility])   08/02/2018 Cancer Staging   Staging form: Oral Cavity, AJCC 8th Edition - Clinical stage from 08/02/2018: Stage Unknown (cTX, cN2a, cM0) - Signed by Lloyd Huger, MD on 08/02/2018   08/23/2018 -  Chemotherapy   The patient had palonosetron (ALOXI) injection 0.25 mg, 0.25 mg, Intravenous,  Once, 5 of 7 cycles Administration: 0.25 mg (08/23/2018), 0.25 mg (08/30/2018), 0.25 mg (09/06/2018), 0.25 mg (09/13/2018), 0.25 mg (09/20/2018) CISplatin (PLATINOL) 92 mg in sodium chloride 0.9 % 250 mL chemo infusion, 40 mg/m2 = 92 mg, Intravenous,  Once, 5 of 7 cycles Administration: 92 mg (08/23/2018), 92 mg (08/30/2018), 92 mg (09/06/2018), 92 mg (09/13/2018), 92 mg (09/20/2018) fosaprepitant (EMEND) 150 mg, dexamethasone (DECADRON) 12 mg in sodium chloride 0.9 % 145 mL IVPB, , Intravenous,  Once, 5 of 7 cycles Administration:  (08/23/2018),  (08/30/2018),  (09/06/2018),  (09/13/2018),  (09/20/2018)  for chemotherapy treatment.      INTERVAL HISTORY: Dustin Hall, 58 year old male with above history of squamous cell carcinoma of the head and neck with unknown primary, presents to survivorship clinic today for initial meeting to review the survivorship care plan  detailing his treatment course for squamous cell carcinoma of the head and neck with unknown primary as well as  monitoring long-term side effects of that treatment, education regarding health maintenance, screening, and overall wellness and health promotion.  Overall patient continues to recover from completing his course of concurrent chemotherapy with cisplatin and radiation.  Cisplatin was discontinued early, patient completed 5 cycles on Sep 20, 2018, due to pancytopenia, weight loss, dysphagia.  He was last seen by ENT/Dr. Tami Ribas on 01/22/2019.  Mirror exam and flexible laryngoscopy performed at that time.  Today, he reports burning of the tongue, continued dry mouth, taste alterations.  His baseline weight prior to starting treatment was around 238 pounds.  Review of Systems  Constitutional: Positive for weight loss. Negative for chills, fever and malaise/fatigue.  HENT: Negative for hearing loss, nosebleeds, sore throat and tinnitus.        Dry mouth, tongue burning; taste changes  Eyes: Negative for blurred vision and double vision.  Respiratory: Negative for cough, hemoptysis, shortness of breath and wheezing.   Cardiovascular: Negative for chest pain, palpitations and leg swelling.  Gastrointestinal: Negative for abdominal pain, blood in stool, constipation, diarrhea, melena, nausea and vomiting.  Genitourinary: Negative for dysuria and urgency.  Musculoskeletal: Negative for back pain, falls, joint pain and myalgias.  Skin: Negative for itching and rash.  Neurological: Negative for dizziness, tingling, sensory change, loss of consciousness, weakness and headaches.  Endo/Heme/Allergies: Negative for environmental allergies. Does not bruise/bleed easily.  Psychiatric/Behavioral: Negative for depression. The patient is not nervous/anxious (anxious with needle sticks (improved- xanax rarely)) and does not have insomnia.     PAST MEDICAL/SURGICAL HISTORY:  Past Medical History:  Diagnosis Date   Cancer [facility])    Coronary artery disease 12/2018   incidental finding: coronary calcum noted in  LAD and RCA on NM PET scan   Past Surgical History:  Procedure Laterality Date   APPENDECTOMY     THROAT SURGERY Right    ALLERGIES:  No Known Allergies   CURRENT MEDICATIONS:  Current Outpatient Medications on File Prior to Visit  Medication Sig Dispense Refill   acetaminophen-codeine 120-12 MG/5ML solution Take 12.5 mLs by mouth every 4 (four) hours as needed for moderate pain. 480 mL 0   aspirin EC 81 MG tablet Take 1 tablet (81 mg total) by mouth daily. 90 tablet 3   atorvastatin (LIPITOR) 40 MG tablet Take 1 tablet (40 mg total) by mouth daily. 90 tablet 3   Multiple Vitamin (MULTI-VITAMIN) tablet Take 1 tablet by mouth 1 day or 1 dose.     No current facility-administered medications on file prior to visit.     ONCOLOGIC FAMILY HISTORY:  Family History  Problem Relation Age of Onset   Aortic aneurysm Father     SOCIAL HISTORY:  Social History   Socioeconomic History   Marital status: Married    Spouse name: Not on file   Number of children: Not on file   Years of education: Not on file   Highest education level: Not on file  Occupational History   Not on file  Social Needs   Financial resource strain: Not on file   Food insecurity    Worry: Not on file    Inability: Not on file   Transportation needs    Medical: Not on file    Non-medical: Not on file  Tobacco Use   Smoking status: Never Smoker   Smokeless tobacco: Current User    Types: Chew  Substance  and Sexual Activity   Alcohol use: Yes    Alcohol/week: 0.0 standard drinks    Comment: occasional   Drug use: No   Sexual activity: Yes    Partners: Female  Lifestyle   Physical activity    Days per week: Not on file    Minutes per session: Not on file   Stress: Not on file  Relationships   Social connections    Talks on phone: Not on file    Gets together: Not on file    Attends religious service: Not on file    Active member of club or organization: Not on file     Attends meetings of clubs or organizations: Not on file    Relationship status: Not on file  Other Topics Concern   Not on file  Social History Narrative   Not on file  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    PHYSICAL EXAMINATION:  Exam limited d/t telemedicine  GENERAL: well appearing. No distress HEENT:  Coated tongue NEURO:  Well oriented.  Appropriate affect. Psych: appropriate mood and memory  LABORATORY DATA:  None at this visit.  DIAGNOSTIC IMAGING:  None for this visit.    ASSESSMENT AND PLAN:  Dustin Hall is a 58 year old pleasant male patient with history of squamous cell carcinoma of head and neck, unknown primary treated with concurrent cisplatin chemotherapy and radiation completed on 10/16/2018 who presents to survivorship clinic for routine follow-up after completing treatment and review of survivorship care plan.   1.  Squamous cell carcinoma of the head and neck, unknown primary:  Dustin Hall is continuing to recover from the effects of cancer treatment.    We discussed NCCN guidelines regarding surveillance protocols including continued follow-up with his ENT physician with history, physical exam, every 1 to 3 months for year 1, every 2 to 6 months for your 2, every 4 to 8 months for years 3-5 and once a year thereafter.  I advised that imaging will be based on his symptoms or exam findings.  Thyroid hormone levels will be monitored every 6 to 12 months given history of radiation.  I encouraged dental evaluation every 6 to 12 months.  Today, comprehensive survivorship care plan and treatment summary was reviewed with the patient detailing his diagnosis, treatment course, late and long-term effects of treatment, surveillance recommendations, and patient education resources.  A copy of this summary, along with a letter will be sent to the patients primary care provider via mail/fax/In Basket message after todays visit.  Dustin Hall is welcome to return to the  Survivorship Clinic in the future, as needed.    2.  Oral candidiasis-likely secondary to persistent dry mouth (see below).  Start nystatin suspension swish and swallow 4 times a day.  If persistent symptoms consider treatment with fluconazole or clotrimazole troches.   3.  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  Encouraged increased hydration, salivary substitutes (calcium phosphate containing solutions, gels containing lysozyme, lactoferrin, and peroxidase), alcohol free mouthwashes, salivary stimulation (gustatory stimulants such as xylitol chewing gum, sorbitol/malic acid lozenges, xylitol lozenges) or if symptoms are refractory can consider cholinergic agonists ( pilocarpine, cevimeline).    4. Nutritional status: Dustin Hall reports that he is currently able to consume adequate nutrition by mouth but has persistent taste alterations.  Pretreatment weight was 238 pounds, BMI 32.37.  Posttreatment weight was 190 pounds, BMI 25.76.  He is followed by dietitian, Jennet Maduro, RD. patient has not been seen since June.  I encouraged  him to contact cancer center to reschedule missed appointment/follow up.   5.  At risk for neck lymphedema: When patients are treated with radiation therapy, there is an associated increased risk of lymphedema.  He has not currently experiencing any symptoms.  If symptoms present in the future could consider referral to lymphedema clinic for compression garment and massage techniques.  6.  At risk for hypothyroidism: The thyroid gland can be affected by treatment.  TSH ordered for his next visit.  He will continue to have serial TSH monitoring for at least the next 5 years as part of his routine follow-up and post cancer care.  7. At risk for tooth decay/dental concerns: After treatment with radiation to neck, patients often experience xerostomia (see above) which increases their risk of dental caries. Dustin Hall was encouraged to see his dentist 3-4 times per year.   His dentist may consider high potency topical fluoride's or fluoride trays.  Encouraged him to reach out to cancer center if he has any additional questions or concerns for our oncology dentist can collaborate with his primary care dentist if additional questions or concerns.  8. Tobacco & alcohol use: Dustin Hall does not have a history of smoking.  Does have a history of tobacco use/snuff.  I encouraged him to abstain from all tobacco products as this does increase risk of recurrence.  Additionally, there is an increased risk of other cancers.   9. Health maintenance and wellness promotion: Cancer patients who consume a diet rich in fruits and vegetables have better overall health and decreased risk of cancer recurrence. Dustin Hall was encouraged to consume 5-7 servings of fruits and vegetables per day, as tolerated. We reviewed the "Nutrition Rainbow" handout. he was also encouraged to engage in moderate to vigorous exercise for 30 minutes per day most days of the week. We discussed the [employer] fitness program & CARE program at [facility], which is designed for cancer survivors to help them become more physically fit after cancer treatments.  If you would like to consider these in the future, I will be happy to send referral.  10. Support services/counseling: It is not uncommon for this period of the patient's cancer care trajectory to be one of many emotions and stressors.  We discussed an opportunity for him to participate in the next session of Head & Neck FYNN ("Finding Your New Normal") support group series, designed for patients after they have completed treatment.   Dustin Hall was encouraged to take advantage of our many other support services programs, support groups, and/or counseling in coping with his new life as a cancer survivor after completing anti-cancer treatment.  he was offered support today through active listening and expressive supportive counseling.  he was given information  regarding our available services and encouraged to contact me with any questions or for help enrolling in any of our support group/programs.    11. 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 Was taking prn xanax prior to phlebotomy. Patient says improved and he does need to continue xanax in the future.   12. Pain - secondary to malignancy and late effect of treatment for malignancy. Currently taking acetaminophen-codeine 120-12 mg/71ml solution every 4 hours as needed per Dr. Grayland Ormond. Can consider weaning off as patient has no evidence of malignancy on last imaging. Will defer to Dr. Grayland Ormond.    Dispo:  -See Dr. Jonna Clark as recommended 07/22/2019 - Follow up with Dr. Grayland Ormond on 04/07/2019 -Follow-up with Dr.Chrystal on 11 08/2018   I  discussed the assessment and treatment plan with the patient. The patient was provided an opportunity to ask questions and all were answered. The patient agreed with the plan and demonstrated an understanding of the instructions.   The patient was advised to call back or seek an in-person evaluation if the symptoms worsen or if the condition fails to improve as anticipated.   I provided 19 minutes of face-to-face video visit time during this encounter, and > 50% was spent counseling as documented under my assessment & plan.   Beckey Rutter, DNP, AGNP-C [facility] [place] [facility]: Philis Nettle, FNP Dr. Grayland Ormond

## 2019-02-25 NOTE — Progress Notes (Signed)
Survivorship Care Plan visit completed.  Treatment summary reviewed and previously mailed to patient.  ASCO answers booklet reviewed and given to patient.  CARE program and Cancer Transitions discussed with patient along with other resources cancer center offers to patients and caregivers.  Patient verbalized understanding. 

## 2019-02-27 ENCOUNTER — Ambulatory Visit
Admission: RE | Admit: 2019-02-27 | Discharge: 2019-02-27 | Disposition: A | Discharge status: Home / Self Care | Payer: BC Managed Care – PPO | Source: Ambulatory Visit | Attending: Radiation Oncology | Admitting: Radiation Oncology

## 2019-02-27 ENCOUNTER — Other Ambulatory Visit: Payer: Self-pay

## 2019-02-27 ENCOUNTER — Other Ambulatory Visit: Payer: Self-pay | Admitting: *Deleted

## 2019-02-27 ENCOUNTER — Encounter: Payer: Self-pay | Admitting: Radiation Oncology

## 2019-02-27 VITALS — BP 117/89 | HR 66 | Temp 97.7°F | Resp 18 | Wt 182.9 lb

## 2019-02-27 DIAGNOSIS — Z923 Personal history of irradiation: Secondary | ICD-10-CM | POA: Insufficient documentation

## 2019-02-27 DIAGNOSIS — R110 Nausea: Secondary | ICD-10-CM | POA: Diagnosis not present

## 2019-02-27 DIAGNOSIS — C760 Malignant neoplasm of head, face and neck: Secondary | ICD-10-CM

## 2019-02-27 DIAGNOSIS — Z9221 Personal history of antineoplastic chemotherapy: Secondary | ICD-10-CM | POA: Insufficient documentation

## 2019-02-27 DIAGNOSIS — C801 Malignant (primary) neoplasm, unspecified: Secondary | ICD-10-CM | POA: Diagnosis not present

## 2019-02-27 NOTE — Progress Notes (Signed)
Radiation Oncology Follow up Note  Name: Dustin Hall   Date:   02/27/2019 MRN:  [id number] DOB: 27-Apr-1960    This 58 y.o. male presents to the clinic today for 61-month follow-up status post concurrent chemoradiation therapy for stage II (TX N1 M0) squamous cell carcinoma unknown primary of the head and neck..  REFERRING PROVIDER: Jodelle Green, FNP  HPI: Patient is a 58 year old male now at 4 months having completed concurrent chemoradiation therapy for head and neck cancer of unknown primary presenting as a neck node metastasis.  Seen today in routine follow-up he is doing fairly well he states his tongue is gotten sore does look like he has oral candidiasis.  He also states his taste is still altered.  Also having some hearing loss in his left ear.  He specifically denies dysphagia or head and neck pain.  He had a PET CT scan approximately.  2 months ago showing no evidence of disease.  COMPLICATIONS OF TREATMENT: none  FOLLOW UP COMPLIANCE: keeps appointments   PHYSICAL EXAM:  BP 117/89   Pulse 66   Temp 97.7 F (36.5 C)   Resp 18   Wt 182 lb 14.4 oz (83 kg)   BMI 24.81 kg/m  Oral cavity shows white patchiness of the tongue consistent with oral candidiasis.  No evidence of oral mucosal lesions are identified neck is clear without evidence of cervical or supraclavicular adenopathy.  Well-developed well-nourished patient in NAD. HEENT reveals PERLA, EOMI, discs not visualized.  Oral cavity is clear. No oral mucosal lesions are identified. Neck is clear without evidence of cervical or supraclavicular adenopathy. Lungs are clear to A&P. Cardiac examination is essentially unremarkable with regular rate and rhythm without murmur rub or thrill. Abdomen is benign with no organomegaly or masses noted. Motor sensory and DTR levels are equal and symmetric in the upper and lower extremities. Cranial nerves II through XII are grossly intact. Proprioception is intact. No peripheral adenopathy or  edema is identified. No motor or sensory levels are noted. Crude visual fields are within normal range.  RADIOLOGY RESULTS: PET scan is reviewed compatible with above-stated findings  PLAN: At this time I am starting the patient on nystatin oral rinses he had been on Diflucan in the past which cause nausea.  Of also suggested a decongestant for his left ear fullness.  He continues close follow-up care with Dr. Jiles Prows.  I have asked to see him back in 6 months for follow-up.  Otherwise patient is doing well patient knows to call with any concerns.  I would like to take this opportunity to thank you for allowing me to participate in the care of your patient.Noreene Filbert, MD

## 2019-03-10 ENCOUNTER — Other Ambulatory Visit: Payer: Self-pay | Admitting: *Deleted

## 2019-03-10 MED ORDER — ACETAMINOPHEN-CODEINE 120-12 MG/5ML PO SOLN: 12.5000 mL | ORAL | 0 refills | Status: DC | PRN

## 2019-03-10 NOTE — Telephone Encounter (Signed)
[facility] does not have Liquid Tylenol with codeine and new prescription needs to go to [place]

## 2019-03-12 ENCOUNTER — Telehealth: Payer: Self-pay | Admitting: *Deleted

## 2019-03-12 ENCOUNTER — Other Ambulatory Visit: Payer: Self-pay | Admitting: Emergency Medicine

## 2019-03-12 ENCOUNTER — Encounter: Payer: Self-pay | Admitting: Emergency Medicine

## 2019-03-12 DIAGNOSIS — C779 Secondary and unspecified malignant neoplasm of lymph node, unspecified: Secondary | ICD-10-CM

## 2019-03-12 DIAGNOSIS — B370 Candidal stomatitis: Secondary | ICD-10-CM

## 2019-03-12 MED ORDER — NYSTATIN 100000 UNIT/ML MT SUSP: 5.0000 mL | Freq: Four times a day (QID) | OROMUCOSAL | 1 refills | Status: DC

## 2019-03-12 NOTE — Telephone Encounter (Signed)
That's fine

## 2019-03-12 NOTE — Telephone Encounter (Signed)
Wife called requesting a prescription for Nystatin be sent in for him she states it looks like he has thrush again

## 2019-03-12 NOTE — Telephone Encounter (Signed)
Wife called asking for another prescription from nystatin. Dr. Grayland Ormond approved refill. [facility] [address].

## 2019-03-12 NOTE — Telephone Encounter (Signed)
[facility] to [facility] on [address]

## 2019-03-24 ENCOUNTER — Other Ambulatory Visit: Payer: Self-pay | Admitting: *Deleted

## 2019-03-24 MED ORDER — ACETAMINOPHEN-CODEINE 300-30 MG PO TABS: 1.0000 | ORAL_TABLET | ORAL | 0 refills | Status: DC | PRN

## 2019-03-24 NOTE — Telephone Encounter (Signed)
Dustin Hall called requesting refill of pain medicine, but in pill form this time

## 2019-04-04 ENCOUNTER — Other Ambulatory Visit: Payer: Self-pay

## 2019-04-04 NOTE — Progress Notes (Signed)
Patient pre screened for office appointment, no questions or concerns today. Patient reminded of upcoming appointment time and date. 

## 2019-04-06 NOTE — Progress Notes (Signed)
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  He currently feels well and is asymptomatic.  He remains active and works full-time.  He states he has a fair appetite, but has lost weight in the interim.  He has chronic dry mouth.  He has no neurologic complaints. He denies any recent fevers or illnesses. He has no chest pain, shortness of breath, cough, or hemoptysis.  He denies any nausea, vomiting, constipation, or diarrhea.  He has no urinary complaints.  Patient offers no further specific complaints today.  REVIEW OF SYSTEMS:   Review of Systems  Constitutional: Positive for weight loss. Negative for fever and malaise/fatigue.  HENT: Negative.  Negative for sore throat.        Dry mouth  Respiratory: Negative.  Negative for cough and hemoptysis.   Cardiovascular: Negative.  Negative for chest pain and leg swelling.  Gastrointestinal: Negative.  Negative for abdominal pain and nausea.  Genitourinary: Negative.  Negative for dysuria.  Musculoskeletal: Negative.  Negative for neck pain.  Skin: Negative.  Negative for rash.  Neurological: Negative.  Negative for dizziness, speech change, focal weakness, weakness and headaches.  Endo/Heme/Allergies: Does not bruise/bleed easily.  Psychiatric/Behavioral: Negative.  The patient is not nervous/anxious.     As per HPI. Otherwise, a complete review of systems is negative.  PAST  MEDICAL HISTORY: Past Medical History:  Diagnosis Date  . Cancer ([place])   . Coronary artery disease 12/2018   incidental finding: coronary calcum noted in LAD and RCA on NM PET scan    PAST SURGICAL HISTORY: Past Surgical History:  Procedure Laterality Date  . APPENDECTOMY    . THROAT SURGERY Right     FAMILY HISTORY: Family History  Problem Relation Age of Onset  . Aortic aneurysm Father     ADVANCED DIRECTIVES (Y/N):  N  HEALTH MAINTENANCE: Social History   Tobacco Use  . Smoking status: Never Smoker  . Smokeless tobacco: Current User    Types: Chew  Substance Use Topics  . Alcohol use: Yes    Alcohol/week: 0.0 standard drinks    Comment: occasional  . Drug use: No     Colonoscopy:  PAP:  Bone density:  Lipid panel:  No Known Allergies  Current Outpatient Medications  Medication Sig Dispense Refill  . acetaminophen-codeine 120-12 MG/5ML solution Take 12.5 mLs by mouth every 4 (four) hours as needed for moderate pain. 480 mL 0  . Acetaminophen-Codeine 300-30 MG tablet Take 1 tablet by mouth every 4 (four) hours as needed for pain. 45 tablet 0  . aspirin EC 81 MG tablet Take 1 tablet (81 mg total) by mouth daily. 90 tablet 3  . atorvastatin (LIPITOR) 40 MG tablet Take 1 tablet (40 mg total) by mouth daily. 90 tablet 3  . nystatin (MYCOSTATIN) 100000 UNIT/ML suspension Take 5 mLs (500,000 Units total) by mouth 4 (four) times daily. Swish and swallow 200 mL 1  . Multiple Vitamin (MULTI-VITAMIN) tablet Take 1 tablet by mouth 1 day or 1 dose.  No current facility-administered medications for this visit.    OBJECTIVE: Vitals:   04/07/19 1429  BP: 120/87  Pulse: (!) 57  Resp: 17  Temp: 98.1 F (36.7 C)  SpO2: 100%     Body mass index is 24.21 kg/m.    ECOG FS:0 - Asymptomatic  General: Well-developed, well-nourished, no acute distress. Eyes: Pink conjunctiva, anicteric sclera. HEENT: Normocephalic, moist mucous membranes.  No palpable  lymphadenopathy. Lungs: No audible wheezing or coughing. Heart: Regular rate and rhythm. Abdomen: Soft, nontender, no obvious distention. Musculoskeletal: No edema, cyanosis, or clubbing. Neuro: Alert, answering all questions appropriately. Cranial nerves grossly intact. Skin: No rashes or petechiae noted. Psych: Normal affect.  LAB RESULTS:  Lab Results  Component Value Date   NA 140 04/07/2019   K 3.9 04/07/2019   CL 106 04/07/2019   CO2 26 04/07/2019   GLUCOSE 94 04/07/2019   BUN 16 04/07/2019   CREATININE 0.74 04/07/2019   CALCIUM 9.5 04/07/2019   PROT 7.7 04/07/2019   ALBUMIN 4.4 04/07/2019   AST 20 04/07/2019   ALT 26 04/07/2019   ALKPHOS 78 04/07/2019   BILITOT 0.6 04/07/2019   GFRNONAA >60 04/07/2019   GFRAA >60 04/07/2019    Lab Results  Component Value Date   WBC 3.5 (L) 04/07/2019   NEUTROABS 2.4 04/07/2019   HGB 12.2 (L) 04/07/2019   HCT 37.3 (L) 04/07/2019   MCV 90.3 04/07/2019   PLT 131 (L) 04/07/2019     STUDIES: No results found.  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  Patient completed XRT on October 16, 2018.  Given patient's persistent pancytopenia, weight loss, and dysphasia cisplatin was discontinued early and he received his fifth and final infusion on Sep 20, 2018.  PET scan results from January 02, 2019 reviewed independently and report as above with no obvious evidence of persistent or recurrent disease.  No intervention is needed at this time.  Continue follow-up with ENT as scheduled.  Return to clinic in 3 months with repeat laboratory can further evaluation. 2.  Dysphasia: Resolved. 3.  Weight loss: Patient's weight is trending down.  I have recommended increased caloric intake including protein shakes. 4.  Anxiety: Continue Xanax as needed. 5.   Pancytopenia: Chronic and unchanged.  Patient's hemoglobin has improved to 12.2. 6.  Dry mouth: Chronic and unchanged.  Secondary to XRT.  Continue symptomatic treatment as directed.  Patient expressed understanding and was in agreement with this plan. He also understands that He can call clinic at any time with any questions, concerns, or complaints.   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

## 2019-04-07 ENCOUNTER — Inpatient Hospital Stay: Payer: BC Managed Care – PPO | Admitting: Oncology

## 2019-04-07 ENCOUNTER — Encounter: Payer: Self-pay | Admitting: Oncology

## 2019-04-07 ENCOUNTER — Inpatient Hospital Stay: Payer: BC Managed Care – PPO | Attending: Oncology

## 2019-04-07 ENCOUNTER — Other Ambulatory Visit: Payer: Self-pay

## 2019-04-07 VITALS — BP 120/87 | HR 57 | Temp 98.1°F | Resp 17 | Wt 178.5 lb

## 2019-04-07 DIAGNOSIS — I2510 Atherosclerotic heart disease of native coronary artery without angina pectoris: Secondary | ICD-10-CM | POA: Diagnosis not present

## 2019-04-07 DIAGNOSIS — Z79899 Other long term (current) drug therapy: Secondary | ICD-10-CM | POA: Diagnosis not present

## 2019-04-07 DIAGNOSIS — C779 Secondary and unspecified malignant neoplasm of lymph node, unspecified: Secondary | ICD-10-CM | POA: Diagnosis not present

## 2019-04-07 DIAGNOSIS — C801 Malignant (primary) neoplasm, unspecified: Secondary | ICD-10-CM | POA: Diagnosis not present

## 2019-04-07 DIAGNOSIS — Z923 Personal history of irradiation: Secondary | ICD-10-CM | POA: Diagnosis not present

## 2019-04-07 DIAGNOSIS — C760 Malignant neoplasm of head, face and neck: Secondary | ICD-10-CM | POA: Diagnosis present

## 2019-04-07 DIAGNOSIS — D61818 Other pancytopenia: Secondary | ICD-10-CM | POA: Insufficient documentation

## 2019-04-07 DIAGNOSIS — R682 Dry mouth, unspecified: Secondary | ICD-10-CM | POA: Insufficient documentation

## 2019-04-07 DIAGNOSIS — F419 Anxiety disorder, unspecified: Secondary | ICD-10-CM | POA: Diagnosis not present

## 2019-04-07 DIAGNOSIS — E785 Hyperlipidemia, unspecified: Secondary | ICD-10-CM | POA: Diagnosis not present

## 2019-04-07 DIAGNOSIS — Z7982 Long term (current) use of aspirin: Secondary | ICD-10-CM | POA: Diagnosis not present

## 2019-04-07 DIAGNOSIS — R634 Abnormal weight loss: Secondary | ICD-10-CM | POA: Insufficient documentation

## 2019-04-07 LAB — CBC WITH DIFFERENTIAL/PLATELET
Abs Immature Granulocytes: 0 10*3/uL (ref 0.00–0.07)
Basophils Absolute: 0 10*3/uL (ref 0.0–0.1)
Basophils Relative: 1 %
Eosinophils Absolute: 0.1 10*3/uL (ref 0.0–0.5)
Eosinophils Relative: 3 %
HCT: 37.3 % — ABNORMAL LOW (ref 39.0–52.0)
Hemoglobin: 12.2 g/dL — ABNORMAL LOW (ref 13.0–17.0)
Immature Granulocytes: 0 %
Lymphocytes Relative: 16 %
Lymphs Abs: 0.6 10*3/uL — ABNORMAL LOW (ref 0.7–4.0)
MCH: 29.5 pg (ref 26.0–34.0)
MCHC: 32.7 g/dL (ref 30.0–36.0)
MCV: 90.3 fL (ref 80.0–100.0)
Monocytes Absolute: 0.3 10*3/uL (ref 0.1–1.0)
Monocytes Relative: 10 %
Neutro Abs: 2.4 10*3/uL (ref 1.7–7.7)
Neutrophils Relative %: 70 %
Platelets: 131 10*3/uL — ABNORMAL LOW (ref 150–400)
RBC: 4.13 MIL/uL — ABNORMAL LOW (ref 4.22–5.81)
RDW: 13.1 % (ref 11.5–15.5)
WBC: 3.5 10*3/uL — ABNORMAL LOW (ref 4.0–10.5)
nRBC: 0 % (ref 0.0–0.2)

## 2019-04-07 LAB — COMPREHENSIVE METABOLIC PANEL
ALT: 26 U/L (ref 0–44)
AST: 20 U/L (ref 15–41)
Albumin: 4.4 g/dL (ref 3.5–5.0)
Alkaline Phosphatase: 78 U/L (ref 38–126)
Anion gap: 8 (ref 5–15)
BUN: 16 mg/dL (ref 6–20)
CO2: 26 mmol/L (ref 22–32)
Calcium: 9.5 mg/dL (ref 8.9–10.3)
Chloride: 106 mmol/L (ref 98–111)
Creatinine, Ser: 0.74 mg/dL (ref 0.61–1.24)
GFR calc Af Amer: >60 mL/min (ref >60)
GFR calc non Af Amer: >60 mL/min (ref >60)
Glucose, Bld: 94 mg/dL (ref 70–99)
Potassium: 3.9 mmol/L (ref 3.5–5.1)
Sodium: 140 mmol/L (ref 135–145)
Total Bilirubin: 0.6 mg/dL (ref 0.3–1.2)
Total Protein: 7.7 g/dL (ref 6.5–8.1)

## 2019-04-08 ENCOUNTER — Other Ambulatory Visit: Payer: Self-pay | Admitting: Oncology

## 2019-04-08 DIAGNOSIS — C779 Secondary and unspecified malignant neoplasm of lymph node, unspecified: Secondary | ICD-10-CM

## 2019-04-08 LAB — THYROID PANEL WITH TSH
Free Thyroxine Index: 1 — ABNORMAL LOW (ref 1.2–4.9)
T3 Uptake Ratio: 25 % (ref 24–39)
T4, Total: 4 ug/dL — ABNORMAL LOW (ref 4.5–12.0)
TSH: 2.06 u[IU]/mL (ref 0.450–4.500)

## 2019-04-11 ENCOUNTER — Other Ambulatory Visit: Payer: Self-pay | Admitting: *Deleted

## 2019-04-11 MED ORDER — ACETAMINOPHEN-CODEINE 300-30 MG PO TABS: 1.0000 | ORAL_TABLET | ORAL | 0 refills | Status: DC | PRN

## 2019-04-22 ENCOUNTER — Other Ambulatory Visit: Payer: Self-pay | Admitting: *Deleted

## 2019-04-22 DIAGNOSIS — C779 Secondary and unspecified malignant neoplasm of lymph node, unspecified: Secondary | ICD-10-CM

## 2019-04-22 DIAGNOSIS — B370 Candidal stomatitis: Secondary | ICD-10-CM

## 2019-04-22 MED ORDER — NYSTATIN 100000 UNIT/ML MT SUSP: 5.0000 mL | Freq: Four times a day (QID) | OROMUCOSAL | 1 refills | Status: DC

## 2019-04-24 ENCOUNTER — Other Ambulatory Visit: Payer: Self-pay | Admitting: *Deleted

## 2019-04-24 ENCOUNTER — Other Ambulatory Visit: Payer: Self-pay | Admitting: Oncology

## 2019-04-24 MED ORDER — ACETAMINOPHEN-CODEINE 300-30 MG PO TABS: 1.0000 | ORAL_TABLET | ORAL | 0 refills | Status: DC | PRN

## 2019-04-24 NOTE — Telephone Encounter (Signed)
I just refilled this 2-3 days ago.

## 2019-04-24 NOTE — Telephone Encounter (Signed)
Hey Dr. Grayland Ormond- could you take a look at this prescription? No mention of pain in your last note. [place] with refill? Thanks!

## 2019-05-14 ENCOUNTER — Other Ambulatory Visit: Payer: Self-pay | Admitting: *Deleted

## 2019-05-14 DIAGNOSIS — C779 Secondary and unspecified malignant neoplasm of lymph node, unspecified: Secondary | ICD-10-CM

## 2019-05-14 DIAGNOSIS — B370 Candidal stomatitis: Secondary | ICD-10-CM

## 2019-05-14 MED ORDER — NYSTATIN 100000 UNIT/ML MT SUSP: 5.0000 mL | Freq: Four times a day (QID) | OROMUCOSAL | 1 refills | Status: DC

## 2019-05-14 MED ORDER — ACETAMINOPHEN-CODEINE 300-30 MG PO TABS: 1.0000 | ORAL_TABLET | ORAL | 0 refills | Status: DC | PRN

## 2019-05-26 ENCOUNTER — Encounter: Payer: Self-pay | Admitting: Cardiology

## 2019-05-26 ENCOUNTER — Ambulatory Visit (INDEPENDENT_AMBULATORY_CARE_PROVIDER_SITE_OTHER): Payer: BC Managed Care – PPO | Admitting: Cardiology

## 2019-05-26 ENCOUNTER — Other Ambulatory Visit: Payer: Self-pay

## 2019-05-26 VITALS — BP 130/80 | HR 67 | Ht 72.0 in | Wt 183.0 lb

## 2019-05-26 DIAGNOSIS — I2584 Coronary atherosclerosis due to calcified coronary lesion: Secondary | ICD-10-CM

## 2019-05-26 DIAGNOSIS — I2510 Atherosclerotic heart disease of native coronary artery without angina pectoris: Secondary | ICD-10-CM | POA: Diagnosis not present

## 2019-05-26 NOTE — Patient Instructions (Signed)
Medication Instructions:  - Your physician recommends that you continue on your current medications as directed. Please refer to the Current Medication list given to you today.  *If you need a refill on your cardiac medications before your next appointment, please call your pharmacy*  Lab Work: - none ordered  If you have labs (blood work) drawn today and your tests are completely normal, you will receive your results only by: Marland Kitchen MyChart Message (if you have MyChart) OR . A paper copy in the mail If you have any lab test that is abnormal or we need to change your treatment, we will call you to review the results.  Testing/Procedures: - none ordered  Follow-Up: At [facility], you and your health needs are our priority.  As part of our continuing mission to provide you with exceptional heart care, we have created designated Provider Care Teams.  These Care Teams include your primary Cardiologist (physician) and Advanced Practice Providers (APPs -  Physician Assistants and Nurse Practitioners) who all work together to provide you with the care you need, when you need it.  Your next appointment:   1 year(s)  The format for your next appointment:   In Person  Provider:   Kate Sable, MD  Other Instructions n/a

## 2019-05-26 NOTE — Progress Notes (Signed)
Cardiology Office Note:    Date:  05/26/2019   ID:  Dustin Hall, DOB 1960/05/09, MRN [id number]  PCP:  Jodelle Green, FNP  Cardiologist:  Kate Sable, MD  Electrophysiologist:  None   Referring MD: Jodelle Green, FNP   Chief Complaint  Patient presents with  . office visit    3 month F/U; Meds verbally reviewed with patient.    History of Present Illness:    Dustin Hall is a 59 y.o. male with a hx of squamous cell carcinoma of the neck, who presents for follow-up.  He was last seen due to coronary artery calcification.    Patient is a former snuff user, states using snuff for about 40 years.  He was diagnosed with a neck mass, found to be squamous cell cancer of the head and neck of unknown primary.  He underwent radiation and chemo, last chemo treatment was Sep 20, 2018.  Patient has been follow-up with PET scans, which showed no persistent or recurrent disease.  Scan in January 02, 2019 was noted to have incidental findings of atherosclerotic calcifications in the left anterior descending and right coronary arteries.  Patient states he feels fine, denies any chest pain, shortness of breath, palpitations, edema, orthopnea, syncope. He is able to do all activities of daily living with no symptoms whatsoever.  Denies any family history of heart disease.  Denies having any history of diabetes, hypertension.    He was started on aspirin 81 mg and Lipitor 40 mg daily.  Fasting lipid panel was ordered.  Past Medical History:  Diagnosis Date  . Cancer ([place])   . Coronary artery disease 12/2018   incidental finding: coronary calcum noted in LAD and RCA on NM PET scan    Past Surgical History:  Procedure Laterality Date  . APPENDECTOMY    . THROAT SURGERY Right     Current Medications: Current Meds  Medication Sig  . Acetaminophen-Codeine 300-30 MG tablet Take 1 tablet by mouth every 4 (four) hours as needed for pain.  Marland Kitchen aspirin EC 81 MG tablet Take 1 tablet (81 mg  total) by mouth daily.  Marland Kitchen atorvastatin (LIPITOR) 40 MG tablet Take 1 tablet (40 mg total) by mouth daily.  . Multiple Vitamin (MULTI-VITAMIN) tablet Take 1 tablet by mouth 1 day or 1 dose.  [place] herbal snuff  . nystatin (MYCOSTATIN) 100000 UNIT/ML suspension Take 5 mLs (500,000 Units total) by mouth 4 (four) times daily. Swish and swallow     Allergies:   Patient has no known allergies.   Social History   Socioeconomic History  . Marital status: Married    Spouse name: Not on file  . Number of children: Not on file  . Years of education: Not on file  . Highest education level: Not on file  Occupational History  . Not on file  Tobacco Use  . Smoking status: Never Smoker  . Smokeless tobacco: Former Systems developer    Types: Chew  Substance and Sexual Activity  . Alcohol use: Yes    Alcohol/week: 0.0 standard drinks    Comment: occasional  . Drug use: No  . Sexual activity: Yes    Partners: Female  Other Topics Concern  . Not on file  Social History Narrative  . Not on file   Social Determinants of Health   Financial Resource Strain:   . Difficulty of Paying Living Expenses: Not on file  Food Insecurity:   . Worried About  Running Out of Food in the Last Year: Not on file  . Ran Out of Food in the Last Year: Not on file  Transportation Needs:   . Lack of Transportation (Medical): Not on file  . Lack of Transportation (Non-Medical): Not on file  Physical Activity:   . Days of Exercise per Week: Not on file  . Minutes of Exercise per Session: Not on file  Stress:   . Feeling of Stress : Not on file  Social Connections:   . Frequency of Communication with Friends and Family: Not on file  . Frequency of Social Gatherings with Friends and Family: Not on file  . Attends Religious Services: Not on file  . Active Member of Clubs or Organizations: Not on file  . Attends Archivist Meetings: Not on file  . Marital Status: Not on file     Family  History: The patient's family history includes Aortic aneurysm in his father.  ROS:   Please see the history of present illness.     All other systems reviewed and are negative.  EKGs/Labs/Other Studies Reviewed:    The following studies were reviewed today: NM PET 01/02/2019 Incidental CT findings: Left anterior descending and right coronary artery atherosclerotic calcification.  ABDOMEN/PELVIS: No significant abnormal hypermetabolic activity in this region.  Incidental CT findings: There are few scattered sigmoid colon diverticula.  SKELETON: No significant abnormal hypermetabolic activity in this region.  Incidental CT findings: none  IMPRESSION: 1. No findings of active malignancy identified. 2. Coronary atherosclerosis.  EKG:  EKG is  ordered today.  The ekg ordered today demonstrates normal sinus rhythm, normal ECG.  Recent Labs: 09/30/2018: Magnesium 2.3 04/07/2019: ALT 26; BUN 16; Creatinine, Ser 0.74; Hemoglobin 12.2; Platelets 131; Potassium 3.9; Sodium 140; TSH 2.060  Recent Lipid Panel    Component Value Date/Time   CHOL 167 01/24/2019 1236   TRIG 97 01/24/2019 1236   HDL 42 01/24/2019 1236   CHOLHDL 4.0 01/24/2019 1236   LDLCALC 107 (H) 01/24/2019 1236   LDLDIRECT 108 (H) 01/24/2019 1236    Physical Exam:    VS:  BP 130/80 (BP Location: Left Arm, Patient Position: Sitting, Cuff Size: Normal)   Pulse 67   Ht 6' (1.829 m)   Wt 183 lb (83 kg)   SpO2 97%   BMI 24.82 kg/m     Wt Readings from Last 3 Encounters:  05/26/19 183 lb (83 kg)  04/07/19 178 lb 8 oz (81 kg)  02/27/19 182 lb 14.4 oz (83 kg)     GEN:  Well nourished, well developed in no acute distress HEENT: Normal NECK: No JVD; No carotid bruits LYMPHATICS: No lymphadenopathy CARDIAC: RRR, no murmurs, rubs, gallops RESPIRATORY:  Clear to auscultation without rales, wheezing or rhonchi  ABDOMEN: Soft, non-tender, non-distended MUSCULOSKELETAL:  No edema; No deformity  SKIN: Warm  and dry NEUROLOGIC:  Alert and oriented x 3 PSYCHIATRIC:  Normal affect   ASSESSMENT:    1. Coronary artery calcification of native artery    PLAN:    In order of problems listed above:  1. Coronary artery calcification noted on PET scan involving the LAD and RCA arteries.  Fasting lipid panel shows an LDL of 107.  His 10-year ASCVD risk score is 6%.  Patient started on aspirin and Lipitor 40 mg for coronary artery calcifications.  He is asymptomatic.  Patient made aware of cardiac symptoms if they were to arise.  Follow-up in about 12 months.  Total encounter time more  than 35 minutes  Greater than 50% was spent in counseling and coordination of care with the patient   Medication Adjustments/Labs and Tests Ordered: Current medicines are reviewed at length with the patient today.  Concerns regarding medicines are outlined above.  Orders Placed This Encounter  Procedures  . EKG 12-Lead   No orders of the defined types were placed in this encounter.   Patient Instructions  Medication Instructions:  - Your physician recommends that you continue on your current medications as directed. Please refer to the Current Medication list given to you today.  *If you need a refill on your cardiac medications before your next appointment, please call your pharmacy*  Lab Work: - none ordered  If you have labs (blood work) drawn today and your tests are completely normal, you will receive your results only by: Marland Kitchen MyChart Message (if you have MyChart) OR . A paper copy in the mail If you have any lab test that is abnormal or we need to change your treatment, we will call you to review the results.  Testing/Procedures: - none ordered  Follow-Up: At [facility], you and your health needs are our priority.  As part of our continuing mission to provide you with exceptional heart care, we have created designated Provider Care Teams.  These Care Teams include your primary Cardiologist  (physician) and Advanced Practice Providers (APPs -  Physician Assistants and Nurse Practitioners) who all work together to provide you with the care you need, when you need it.  Your next appointment:   1 year(s)  The format for your next appointment:   In Person  Provider:   Kate Sable, MD  Other Instructions n/a     Signed, Kate Sable, MD  05/26/2019 12:13 PM    [place]

## 2019-06-02 ENCOUNTER — Other Ambulatory Visit: Payer: Self-pay | Admitting: *Deleted

## 2019-06-02 DIAGNOSIS — C779 Secondary and unspecified malignant neoplasm of lymph node, unspecified: Secondary | ICD-10-CM

## 2019-06-02 DIAGNOSIS — B370 Candidal stomatitis: Secondary | ICD-10-CM

## 2019-06-02 MED ORDER — NYSTATIN 100000 UNIT/ML MT SUSP: 5.0000 mL | Freq: Four times a day (QID) | OROMUCOSAL | 1 refills | Status: DC

## 2019-06-02 MED ORDER — ACETAMINOPHEN-CODEINE 300-30 MG PO TABS: 1.0000 | ORAL_TABLET | ORAL | 0 refills | Status: DC | PRN

## 2019-06-11 ENCOUNTER — Other Ambulatory Visit: Payer: Self-pay | Admitting: *Deleted

## 2019-06-11 MED ORDER — ACETAMINOPHEN-CODEINE 300-30 MG PO TABS: 1.0000 | ORAL_TABLET | ORAL | 0 refills | Status: DC | PRN

## 2019-06-17 ENCOUNTER — Other Ambulatory Visit: Payer: Self-pay

## 2019-06-17 ENCOUNTER — Inpatient Hospital Stay: Payer: BC Managed Care – PPO | Attending: Oncology | Admitting: Oncology

## 2019-06-17 DIAGNOSIS — Z7982 Long term (current) use of aspirin: Secondary | ICD-10-CM | POA: Diagnosis not present

## 2019-06-17 DIAGNOSIS — C801 Malignant (primary) neoplasm, unspecified: Secondary | ICD-10-CM | POA: Insufficient documentation

## 2019-06-17 DIAGNOSIS — I2510 Atherosclerotic heart disease of native coronary artery without angina pectoris: Secondary | ICD-10-CM | POA: Insufficient documentation

## 2019-06-17 DIAGNOSIS — Z9221 Personal history of antineoplastic chemotherapy: Secondary | ICD-10-CM | POA: Insufficient documentation

## 2019-06-17 DIAGNOSIS — Z923 Personal history of irradiation: Secondary | ICD-10-CM | POA: Diagnosis not present

## 2019-06-17 DIAGNOSIS — Z79899 Other long term (current) drug therapy: Secondary | ICD-10-CM | POA: Insufficient documentation

## 2019-06-17 DIAGNOSIS — C770 Secondary and unspecified malignant neoplasm of lymph nodes of head, face and neck: Secondary | ICD-10-CM | POA: Insufficient documentation

## 2019-06-17 DIAGNOSIS — C779 Secondary and unspecified malignant neoplasm of lymph node, unspecified: Secondary | ICD-10-CM

## 2019-06-17 DIAGNOSIS — D61818 Other pancytopenia: Secondary | ICD-10-CM | POA: Insufficient documentation

## 2019-06-17 DIAGNOSIS — R6884 Jaw pain: Secondary | ICD-10-CM | POA: Diagnosis not present

## 2019-06-17 MED ORDER — ACETAMINOPHEN-CODEINE 300-30 MG PO TABS: 1.0000 | ORAL_TABLET | Freq: Three times a day (TID) | ORAL | 0 refills | Status: DC | PRN

## 2019-06-17 NOTE — Progress Notes (Signed)
Symptom Management Consult note 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 [facility])  08/02/2018 Initial Diagnosis   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 [facility])   08/02/2018 Cancer Staging   Staging form: Oral Cavity, AJCC 8th Edition - Clinical stage from 08/02/2018: Stage Unknown (cTX, cN2a, cM0) - Signed by Lloyd Huger, MD on 08/02/2018   08/23/2018 -  Chemotherapy   The patient had palonosetron (ALOXI) injection 0.25 mg, 0.25 mg, Intravenous,  Once, 5 of 7 cycles Administration: 0.25 mg (08/23/2018), 0.25 mg (08/30/2018), 0.25 mg (09/06/2018), 0.25 mg (09/13/2018), 0.25 mg (09/20/2018) CISplatin (PLATINOL) 92 mg in sodium chloride 0.9 % 250 mL chemo infusion, 40 mg/m2 = 92 mg, Intravenous,  Once, 5 of 7 cycles Administration: 92 mg (08/23/2018), 92 mg (08/30/2018), 92 mg (09/06/2018), 92 mg (09/13/2018), 92 mg (09/20/2018) fosaprepitant (EMEND) 150 mg, dexamethasone (DECADRON) 12 mg in sodium chloride 0.9 % 145 mL IVPB, , Intravenous,  Once, 5 of 7 cycles Administration:  (08/23/2018),  (08/30/2018),  (09/06/2018),  (09/13/2018),  (09/20/2018)  for chemotherapy treatment.     Interval history-patient presents to symptom management today for follow-up.  He was last evaluated by Dr. Grayland Ormond on 04/07/2019 for lab work and evaluation.  At  that time he was doing well and was working full-time.  He admitted to chronic dry mouth but denied any other symptoms.  He continued to have persistent weight loss due to "decrease in appetite and change in taste buds".  He has completed both chemo and radiation back in June 2020.  Most recent PET scan from September 2020 did not reveal any persistent or recurrent disease.  He was instructed to follow-up with ENT and return to our clinic in approximately 3 months.   Today, patient states he feels well.  He denies any significant change from follow-up with Dr. Grayland Ormond in December.  He has persistent dry mouth since radiation.  He uses Biotene which helps some.  He has "tooth pain" since completing radiation is located mainly on left lower and upper jaw. Worse at night affecting his sleep. He usuually takes 1 tylenol # 3 at bedtime and an occasional one during the day.  He denies any dysphagia at this time.  Per patient, he developed an abscess following treatment and has not fully recovered since that time.  He denies any recent fevers or illness, chest pain, nausea, vomiting or constipation.  Denies diarrhea or urinary concerns.  ECOG FS:0 - Asymptomatic  Review of systems- Review of Systems  Constitutional: Negative.  Negative for chills, fever, malaise/fatigue and weight loss.  HENT: Negative for congestion, ear pain and tinnitus.        Left sided jaw and tooth pain.  Eyes: Negative.  Negative for blurred vision and double vision.  Respiratory: Negative.  Negative for cough, sputum production and shortness of breath.   Cardiovascular: Negative.  Negative for chest pain, palpitations  and leg swelling.  Gastrointestinal: Negative.  Negative for abdominal pain, constipation, diarrhea, nausea and vomiting.  Genitourinary: Negative for dysuria, frequency and urgency.  Musculoskeletal: Negative for back pain and falls.  Skin: Negative.  Negative for rash.  Neurological: Negative.  Negative for  weakness and headaches.  Endo/Heme/Allergies: Negative.  Does not bruise/bleed easily.  Psychiatric/Behavioral: Negative.  Negative for depression. The patient is not nervous/anxious and does not have insomnia.      Current treatment-currently on surveillance.  Completed 5 cycles of cisplatin with radiation on 09/20/2018.  No Known Allergies   Past Medical History:  Diagnosis Date  . Cancer ([place])   . Coronary artery disease 12/2018   incidental finding: coronary calcum noted in LAD and RCA on NM PET scan     Past Surgical History:  Procedure Laterality Date  . APPENDECTOMY    . THROAT SURGERY Right     Social History   Socioeconomic History  . Marital status: Married    Spouse name: Not on file  . Number of children: Not on file  . Years of education: Not on file  . Highest education level: Not on file  Occupational History  . Not on file  Tobacco Use  . Smoking status: Never Smoker  . Smokeless tobacco: Former Systems developer    Types: Chew  Substance and Sexual Activity  . Alcohol use: Yes    Alcohol/week: 0.0 standard drinks    Comment: occasional  . Drug use: No  . Sexual activity: Yes    Partners: Female  Other Topics Concern  . Not on file  Social History Narrative  . Not on file   Social Determinants of Health   Financial Resource Strain:   . Difficulty of Paying Living Expenses: Not on file  Food Insecurity:   . Worried About Charity fundraiser in the Last Year: Not on file  . Ran Out of Food in the Last Year: Not on file  Transportation Needs:   . Lack of Transportation (Medical): Not on file  . Lack of Transportation (Non-Medical): Not on file  Physical Activity:   . Days of Exercise per Week: Not on file  . Minutes of Exercise per Session: Not on file  Stress:   . Feeling of Stress : Not on file  Social Connections:   . Frequency of Communication with Friends and Family: Not on file  . Frequency of Social Gatherings with Friends and Family: Not on  file  . Attends Religious Services: Not on file  . Active Member of Clubs or Organizations: Not on file  . Attends Archivist Meetings: Not on file  . Marital Status: Not on file  Intimate Partner Violence:   . Fear of Current or Ex-Partner: Not on file  . Emotionally Abused: Not on file  . Physically Abused: Not on file  . Sexually Abused: Not on file    Family History  Problem Relation Age of Onset  . Aortic aneurysm Father      Current Outpatient Medications:  .  Acetaminophen-Codeine 300-30 MG tablet, Take 1 tablet by mouth every 4 (four) hours as needed for pain., Disp: 60 tablet, Rfl: 0 .  aspirin EC 81 MG tablet, Take 1 tablet (81 mg total) by mouth daily., Disp: 90 tablet, Rfl: 3 .  atorvastatin (LIPITOR) 40 MG tablet, Take 1 tablet (40 mg total) by mouth daily., Disp: 90 tablet, Rfl: 3 .  Multiple Vitamin (MULTI-VITAMIN) tablet, Take 1 tablet by mouth 1 day  or 1 dose., Disp: , Rfl:  .  NON FORMULARY, [place] herbal snuff, Disp: , Rfl:  .  nystatin (MYCOSTATIN) 100000 UNIT/ML suspension, Take 5 mLs (500,000 Units total) by mouth 4 (four) times daily. Swish and swallow, Disp: 200 mL, Rfl: 1  Physical exam: There were no vitals filed for this visit. Physical Exam Vitals reviewed.  Constitutional:      Appearance: Normal appearance.  HENT:     Head: Normocephalic and atraumatic.  Eyes:     Pupils: Pupils are equal, round, and reactive to light.  Cardiovascular:     Rate and Rhythm: Normal rate and regular rhythm.     Heart sounds: Normal heart sounds. No murmur.  Pulmonary:     Effort: Pulmonary effort is normal.     Breath sounds: Normal breath sounds. No wheezing.  Abdominal:     General: Bowel sounds are normal. There is no distension.     Palpations: Abdomen is soft.     Tenderness: There is no abdominal tenderness.  Musculoskeletal:        General: Normal range of motion.     Cervical back: Normal range of motion.  Skin:    General: Skin is  warm and dry.     Findings: No rash.  Neurological:     Mental Status: He is alert and oriented to person, place, and time.  Psychiatric:        Judgment: Judgment normal.      CMP Latest Ref Rng & Units 04/07/2019  Glucose 70 - 99 mg/dL 94  BUN 6 - 20 mg/dL 16  Creatinine 0.61 - 1.24 mg/dL 0.74  Sodium 135 - 145 mmol/L 140  Potassium 3.5 - 5.1 mmol/L 3.9  Chloride 98 - 111 mmol/L 106  CO2 22 - 32 mmol/L 26  Calcium 8.9 - 10.3 mg/dL 9.5  Total Protein 6.5 - 8.1 g/dL 7.7  Total Bilirubin 0.3 - 1.2 mg/dL 0.6  Alkaline Phos 38 - 126 U/L 78  AST 15 - 41 U/L 20  ALT 0 - 44 U/L 26   CBC Latest Ref Rng & Units 04/07/2019  WBC 4.0 - 10.5 K/uL 3.5(L)  Hemoglobin 13.0 - 17.0 g/dL 12.2(L)  Hematocrit 39.0 - 52.0 % 37.3(L)  Platelets 150 - 400 K/uL 131(L)    No images are attached to the encounter.  No results found. = Assessment and plan- Patient is a 59 y.o. male with above history who presents to symptom management for management of his pain medication.  Squamous cell carcinoma: Status post weekly cisplatin and daily radiation.  This was completed in June 2020.  Treatment was discontinued early secondary to persistent pancytopenia, weight loss and dysphagia.  PET scan from 01/02/2019 showed no findings of active malignancy.  He is currently on surveillance.  Jaw pain/tooth sensitivity: Per patient he was evaluated by his dentist and found to have a large abscess post radiation.  States he mainly takes his pain medication at bedtime to help with sleep.  Patient to reduce the amount of Tylenol 3 he is taking on a daily basis.   Dry mouth: Recommend Biotene and other mouth moisturizers.  Recommend seeing his dentist to see if there are recommendations for his tooth sensitivity and dry mouth.  Plan: Patient to gradually decrease off of Tylenol No. 3 given no active malignancy. Patient to follow-up with dentist for recommendations on dry mouth and tooth sensitivity. RX Tylenol # 3  every 8 ours prn for pain.   Disposition: RTC  as scheduled next month for follow-up.  New Rx sent to pharmacy post-dated for 06/20/19- Tylenol #3 q 8 hours prn for pain.     Visit Diagnosis 1. 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 He also understands that He can call clinic at any time with any questions, concerns, or complaints.   Greater than 50% was spent in counseling and coordination of care with this patient including but not limited to discussion of the relevant topics above (See A&P) including, but not limited to diagnosis and management of acute and chronic medical conditions.   Thank you for allowing me to participate in the care of this very pleasant patient.    Jacquelin Hawking, NP [place] at [facility] Cell - [phone] Pager- [phone] 06/17/2019 3:12 PM

## 2019-07-03 NOTE — Progress Notes (Signed)
[place]  Telephone:(336) [phone] Fax:(336) [phone]  ID: Marella Bile OB: May 09, 1960  MR#: [id number]  [id number]  Patient Care Team: Jodelle Green, FNP as PCP - General (Family Medicine) Kate Sable, MD as PCP - Cardiology (Cardiology) Beverly Gust, MD (Otolaryngology) Lloyd Huger, MD as Consulting Physician (Oncology) Noreene Filbert, MD as Referring Physician (Radiation Oncology)  CHIEF COMPLAINT: Squamous cell cancer of the head and neck, unknown primary  INTERVAL HISTORY: Patient returns to clinic today for repeat laboratory work and routine 19-month evaluation.  He continues to have dry mouth and occasional dental pain, but otherwise feels well.  He has a fair appetite and is having difficulty gaining weight. He has no neurologic complaints. He denies any recent fevers or illnesses. He has no chest pain, shortness of breath, cough, or hemoptysis.  He denies any nausea, vomiting, constipation, or diarrhea.  He has no urinary complaints.  Patient offers no further specific complaints today.    REVIEW OF SYSTEMS:   Review of Systems  Constitutional: Negative.  Negative for fever, malaise/fatigue and weight loss.  HENT: Negative.  Negative for sore throat.        Dry mouth  Respiratory: Negative.  Negative for cough and hemoptysis.   Cardiovascular: Negative.  Negative for chest pain and leg swelling.  Gastrointestinal: Negative.  Negative for abdominal pain and nausea.  Genitourinary: Negative.  Negative for dysuria.  Musculoskeletal: Negative.  Negative for neck pain.  Skin: Negative.  Negative for rash.  Neurological: Negative.  Negative for dizziness, speech change, focal weakness, weakness and headaches.  Endo/Heme/Allergies: Does not bruise/bleed easily.  Psychiatric/Behavioral: Negative.  The patient is not nervous/anxious.     As per HPI. Otherwise, a complete review of systems is negative.  PAST MEDICAL HISTORY: Past  Medical History:  Diagnosis Date  . Cancer ([place])   . Coronary artery disease 12/2018   incidental finding: coronary calcum noted in LAD and RCA on NM PET scan    PAST SURGICAL HISTORY: Past Surgical History:  Procedure Laterality Date  . APPENDECTOMY    . THROAT SURGERY Right     FAMILY HISTORY: Family History  Problem Relation Age of Onset  . Aortic aneurysm Father     ADVANCED DIRECTIVES (Y/N):  N  HEALTH MAINTENANCE: Social History   Tobacco Use  . Smoking status: Never Smoker  . Smokeless tobacco: Current User    Types: Snuff  . Tobacco comment: [place] herbal snuff   Substance Use Topics  . Alcohol use: Yes    Alcohol/week: 0.0 standard drinks    Comment: occasional  . Drug use: No     Colonoscopy:  PAP:  Bone density:  Lipid panel:  No Known Allergies  Current Outpatient Medications  Medication Sig Dispense Refill  . Acetaminophen-Codeine 300-30 MG tablet Take 1 tablet by mouth every 8 (eight) hours as needed for pain. 60 tablet 0  . aspirin EC 81 MG tablet Take 1 tablet (81 mg total) by mouth daily. 90 tablet 3  . atorvastatin (LIPITOR) 40 MG tablet Take 1 tablet (40 mg total) by mouth daily. 90 tablet 3  . Multiple Vitamin (MULTI-VITAMIN) tablet Take 1 tablet by mouth 1 day or 1 dose.    . nystatin (MYCOSTATIN) 100000 UNIT/ML suspension Take 5 mLs (500,000 Units total) by mouth 4 (four) times daily. Swish and swallow 200 mL 1  . omega-3 fish oil (MAXEPA) 1000 MG CAPS capsule Take 1 capsule by mouth daily.     No  current facility-administered medications for this visit.    OBJECTIVE: Vitals:   07/07/19 1421  BP: 126/82  Pulse: (!) 54  Temp: (!) 96.3 F (35.7 C)     Body mass index is 24.58 kg/m.    ECOG FS:0 - Asymptomatic  General: Well-developed, well-nourished, no acute distress. Eyes: Pink conjunctiva, anicteric sclera. HEENT: Normocephalic, moist mucous membranes.  No palpable lymphadenopathy. Lungs: No audible wheezing or  coughing. Heart: Regular rate and rhythm. Abdomen: Soft, nontender, no obvious distention. Musculoskeletal: No edema, cyanosis, or clubbing. Neuro: Alert, answering all questions appropriately. Cranial nerves grossly intact. Skin: No rashes or petechiae noted. Psych: Normal affect.  LAB RESULTS:  Lab Results  Component Value Date   NA 137 07/07/2019   K 4.5 07/07/2019   CL 105 07/07/2019   CO2 27 07/07/2019   GLUCOSE 90 07/07/2019   BUN 19 07/07/2019   CREATININE 0.74 07/07/2019   CALCIUM 9.3 07/07/2019   PROT 7.7 04/07/2019   ALBUMIN 4.4 04/07/2019   AST 20 04/07/2019   ALT 26 04/07/2019   ALKPHOS 78 04/07/2019   BILITOT 0.6 04/07/2019   GFRNONAA >60 07/07/2019   GFRAA >60 07/07/2019    Lab Results  Component Value Date   WBC 4.1 07/07/2019   NEUTROABS 2.8 07/07/2019   HGB 12.5 (L) 07/07/2019   HCT 37.4 (L) 07/07/2019   MCV 89.7 07/07/2019   PLT 139 (L) 07/07/2019     STUDIES: No results found.  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  Given patient's persistent pancytopenia, weight loss, and dysphasia, cisplatin was discontinued early and he received his fifth and final infusion on Sep 20, 2018.  PET scan results from January 02, 2019 reviewed independently with no obvious evidence of persistent or recurrent disease.  No further imaging is necessary unless there is concern of recurrence.  He reports a recent normal evaluation by ENT.  No further intervention is needed.  Return to clinic in 6 months for routine evaluation.   2.  Dysphasia: Resolved. 3.  Weight loss: Patient's weight has remained stable since November 2020.  He had consultation with dietary today for further recommendations. 4.  Anxiety: Continue Xanax as needed. 5.  Pancytopenia: Improved.  Patient's white blood cell count is now within  normal limits.  Platelets have trended up and are now 139.   6.  Dry mouth/dental pain: Chronic and unchanged.  Secondary to XRT.  Continue symptomatic treatment as directed.  Patient expressed understanding and was in agreement with this plan. He also understands that He can call clinic at any time with any questions, concerns, or complaints.   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

## 2019-07-07 ENCOUNTER — Inpatient Hospital Stay (HOSPITAL_BASED_OUTPATIENT_CLINIC_OR_DEPARTMENT_OTHER): Payer: BC Managed Care – PPO | Admitting: Oncology

## 2019-07-07 ENCOUNTER — Other Ambulatory Visit: Payer: Self-pay

## 2019-07-07 ENCOUNTER — Inpatient Hospital Stay: Payer: BC Managed Care – PPO | Attending: Oncology

## 2019-07-07 ENCOUNTER — Inpatient Hospital Stay: Payer: BC Managed Care – PPO

## 2019-07-07 ENCOUNTER — Encounter: Payer: Self-pay | Admitting: Oncology

## 2019-07-07 VITALS — BP 126/82 | HR 54 | Temp 96.3°F | Wt 181.2 lb

## 2019-07-07 DIAGNOSIS — Z923 Personal history of irradiation: Secondary | ICD-10-CM | POA: Insufficient documentation

## 2019-07-07 DIAGNOSIS — F419 Anxiety disorder, unspecified: Secondary | ICD-10-CM | POA: Insufficient documentation

## 2019-07-07 DIAGNOSIS — Z87891 Personal history of nicotine dependence: Secondary | ICD-10-CM | POA: Diagnosis not present

## 2019-07-07 DIAGNOSIS — Z9221 Personal history of antineoplastic chemotherapy: Secondary | ICD-10-CM | POA: Diagnosis not present

## 2019-07-07 DIAGNOSIS — R634 Abnormal weight loss: Secondary | ICD-10-CM | POA: Insufficient documentation

## 2019-07-07 DIAGNOSIS — R682 Dry mouth, unspecified: Secondary | ICD-10-CM | POA: Diagnosis not present

## 2019-07-07 DIAGNOSIS — Z8589 Personal history of malignant neoplasm of other organs and systems: Secondary | ICD-10-CM | POA: Insufficient documentation

## 2019-07-07 DIAGNOSIS — C779 Secondary and unspecified malignant neoplasm of lymph node, unspecified: Secondary | ICD-10-CM

## 2019-07-07 DIAGNOSIS — Z79899 Other long term (current) drug therapy: Secondary | ICD-10-CM | POA: Diagnosis not present

## 2019-07-07 DIAGNOSIS — C801 Malignant (primary) neoplasm, unspecified: Secondary | ICD-10-CM | POA: Diagnosis not present

## 2019-07-07 DIAGNOSIS — Z7982 Long term (current) use of aspirin: Secondary | ICD-10-CM | POA: Insufficient documentation

## 2019-07-07 DIAGNOSIS — K0389 Other specified diseases of hard tissues of teeth: Secondary | ICD-10-CM | POA: Diagnosis not present

## 2019-07-07 DIAGNOSIS — D61818 Other pancytopenia: Secondary | ICD-10-CM | POA: Insufficient documentation

## 2019-07-07 DIAGNOSIS — G8929 Other chronic pain: Secondary | ICD-10-CM | POA: Insufficient documentation

## 2019-07-07 LAB — BASIC METABOLIC PANEL
Anion gap: 5 (ref 5–15)
BUN: 19 mg/dL (ref 6–20)
CO2: 27 mmol/L (ref 22–32)
Calcium: 9.3 mg/dL (ref 8.9–10.3)
Chloride: 105 mmol/L (ref 98–111)
Creatinine, Ser: 0.74 mg/dL (ref 0.61–1.24)
GFR calc Af Amer: >60 mL/min (ref >60)
GFR calc non Af Amer: >60 mL/min (ref >60)
Glucose, Bld: 90 mg/dL (ref 70–99)
Potassium: 4.5 mmol/L (ref 3.5–5.1)
Sodium: 137 mmol/L (ref 135–145)

## 2019-07-07 LAB — CBC WITH DIFFERENTIAL/PLATELET
Abs Immature Granulocytes: 0.01 10*3/uL (ref 0.00–0.07)
Basophils Absolute: 0 10*3/uL (ref 0.0–0.1)
Basophils Relative: 1 %
Eosinophils Absolute: 0.2 10*3/uL (ref 0.0–0.5)
Eosinophils Relative: 4 %
HCT: 37.4 % — ABNORMAL LOW (ref 39.0–52.0)
Hemoglobin: 12.5 g/dL — ABNORMAL LOW (ref 13.0–17.0)
Immature Granulocytes: 0 %
Lymphocytes Relative: 17 %
Lymphs Abs: 0.7 10*3/uL (ref 0.7–4.0)
MCH: 30 pg (ref 26.0–34.0)
MCHC: 33.4 g/dL (ref 30.0–36.0)
MCV: 89.7 fL (ref 80.0–100.0)
Monocytes Absolute: 0.4 10*3/uL (ref 0.1–1.0)
Monocytes Relative: 9 %
Neutro Abs: 2.8 10*3/uL (ref 1.7–7.7)
Neutrophils Relative %: 69 %
Platelets: 139 10*3/uL — ABNORMAL LOW (ref 150–400)
RBC: 4.17 MIL/uL — ABNORMAL LOW (ref 4.22–5.81)
RDW: 12.8 % (ref 11.5–15.5)
WBC: 4.1 10*3/uL (ref 4.0–10.5)
nRBC: 0 % (ref 0.0–0.2)

## 2019-07-07 NOTE — Progress Notes (Signed)
Nutrition Assessment   Reason for Assessment:  Referral from Dr. Grayland Ormond (add-on) for problems gaining weight.   ASSESSMENT:  59 year old male with metastatic squamous cell carcinoma involving lymph node with unknown primary site.  Patient s/p chemotherapy and radiation therapy completed in June 2020.    Met with patient in exam room.  Patient reports that he has dry mouth and foods do not taste the same as before treatment.  Reports that he can't taste sweet foods or anything chocolate.  Reports salsa burns his mouth.  Works 3rd shift so often will eat cereal around 7-8am then goes to sleep.  Later will eat potato chips. Around 4:30-5:30 will eat meat with couple of sides.  Has snack around 11-12 and then Lunch around 3pm (boiled eggs).  Sometimes will make a protein shake with protein powder, peanut butter, chocolate syrup, ice and water.     Medications: reviewed   Labs: reviewed   Anthropometrics:   Weight 181 lb 3.2 oz today.  Noted 2/1 183 lb 11/5 182 lb 7/7 188 lb  Reports has been more active at work.    Estimated Energy Needs  Kcals: 4175-3010 Protein: 120-140 g Fluid: > 2.4 L   NUTRITION DIAGNOSIS: Inadequate oral intake related to cancer related treatment side effects as evidenced by unable to gain weight, dry mouth, lack of taste.    INTERVENTION:  Discussed ways to add in calories and protein.  Encouraged mixing shake with milk, not water for more calories and protein. High Calorie, High protein Nutrition Therapy handout from [facility] provided to patient.  Provided sample meal plan for patient  Discussed dips and sauces, seasoning to try for taste change.      MONITORING, EVALUATION, GOAL: Patient will consume adequate calories and protein to increase weight   Next Visit: no follow-up.  Patient to contact RD if needed  Niya Behler B. Zenia Resides, [place], [place] Registered Dietitian [phone] (pager)

## 2019-07-08 LAB — THYROID PANEL WITH TSH
Free Thyroxine Index: 1.1 — ABNORMAL LOW (ref 1.2–4.9)
T3 Uptake Ratio: 26 % (ref 24–39)
T4, Total: 4.4 ug/dL — ABNORMAL LOW (ref 4.5–12.0)
TSH: 3.18 u[IU]/mL (ref 0.450–4.500)

## 2019-07-14 ENCOUNTER — Inpatient Hospital Stay: Payer: BC Managed Care – PPO

## 2019-07-15 ENCOUNTER — Other Ambulatory Visit: Payer: Self-pay | Admitting: *Deleted

## 2019-07-16 IMAGING — US ULTRASOUND OF THE LYMPH NODES
1 series · 12 of 12 positions shown · non-contrast
Comparison: none

INDICATION: Left cervical lymph node

[Series 1: ultrasound of the lymph nodes · 0.06mm/px · 12 of 12 slices shown]
[im 1/12]
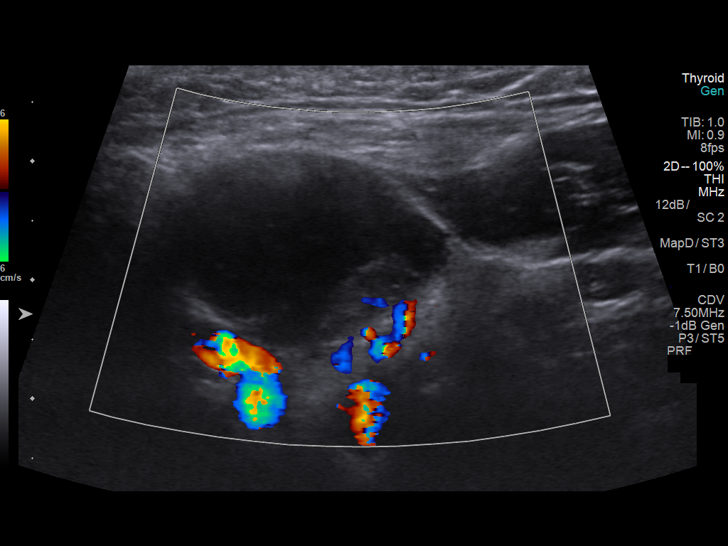
[im 2/12]
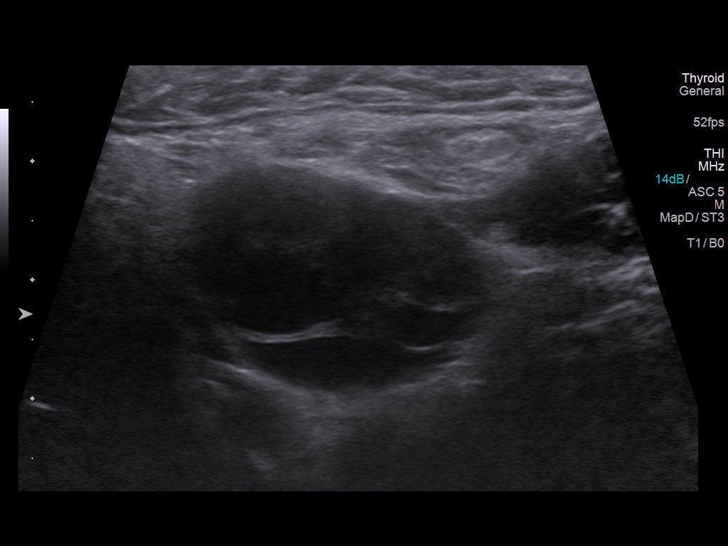
[im 3/12]
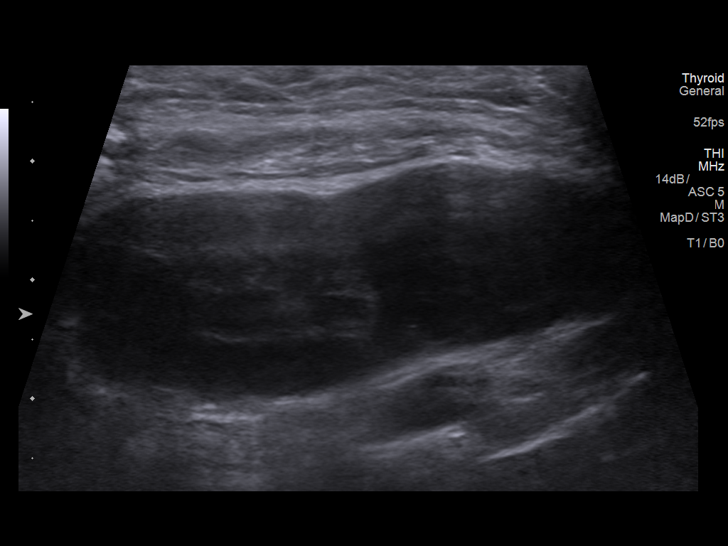
[im 4/12]
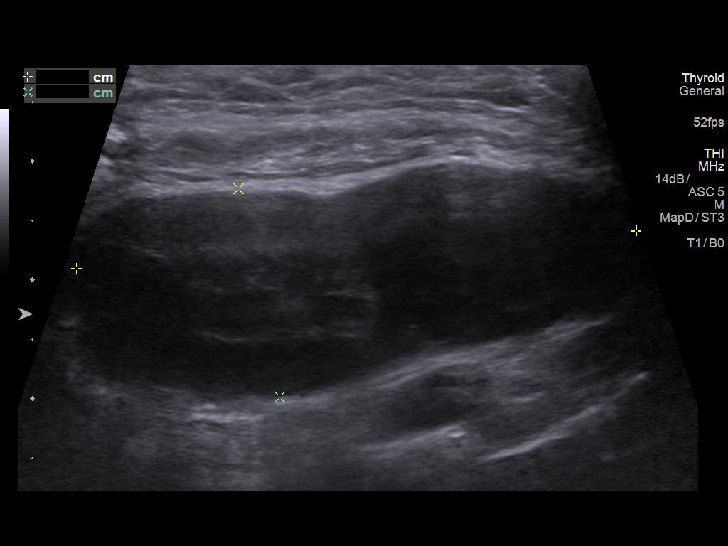
[im 5/12]
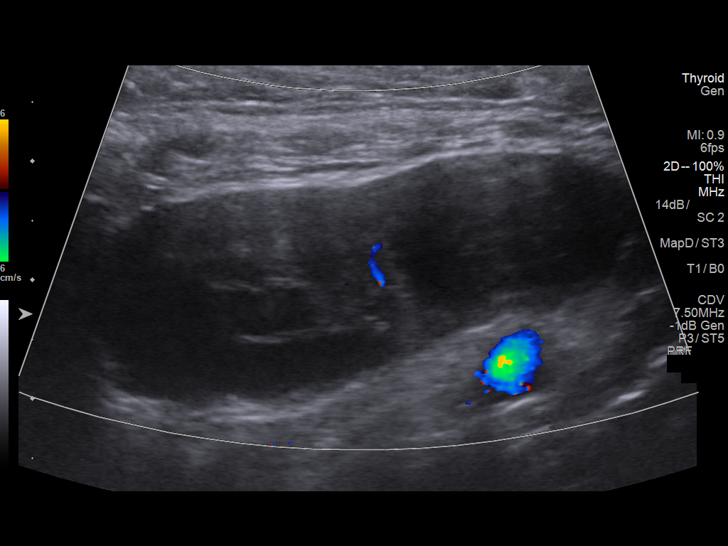
[im 6/12]
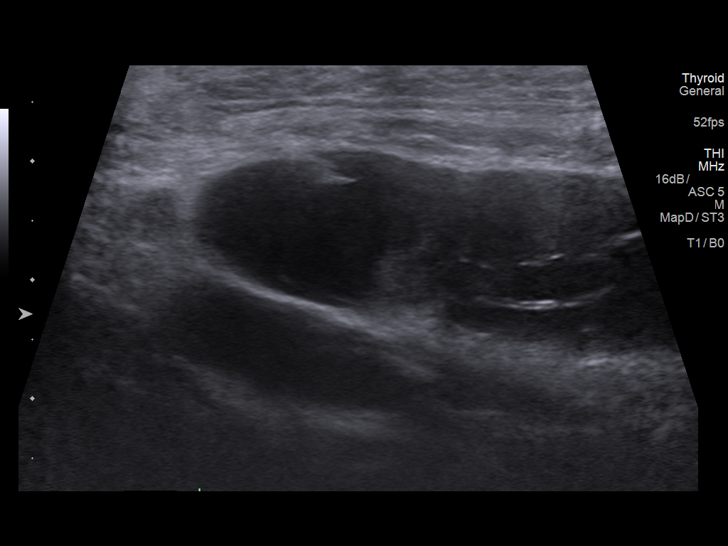
[im 7/12]
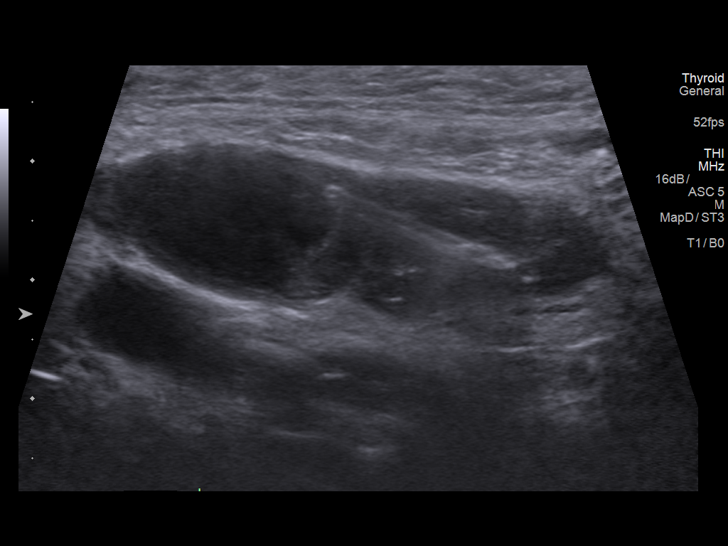
[im 8/12]
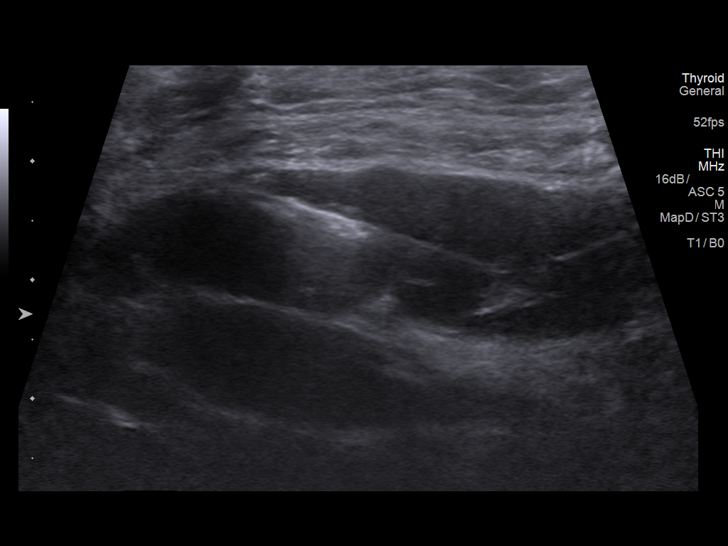
[im 9/12]
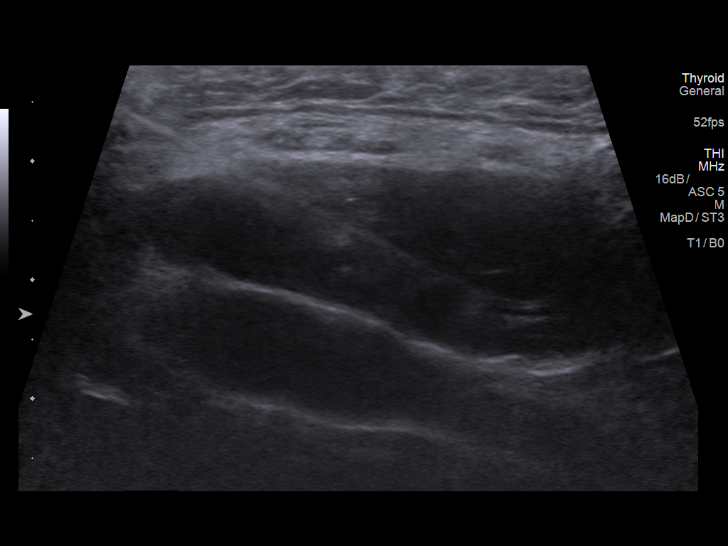
[im 10/12]
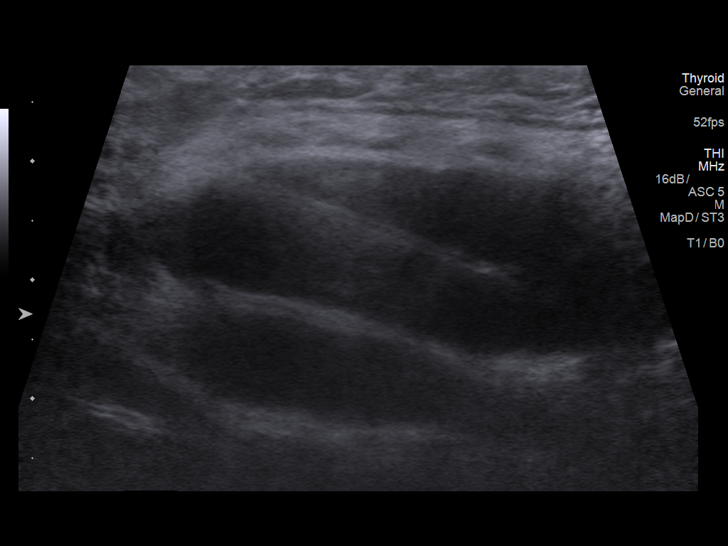
[im 11/12]
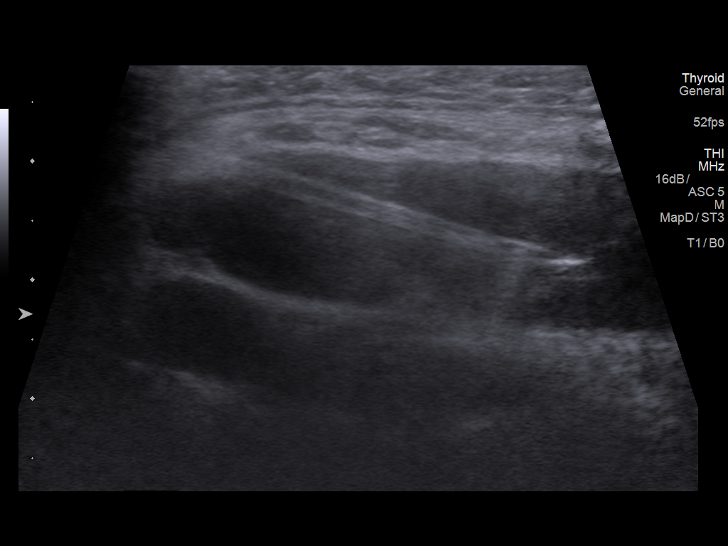
[im 12/12]
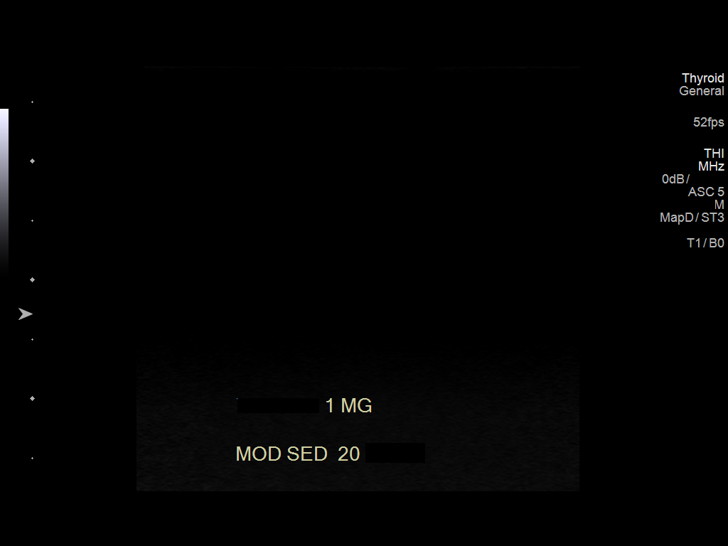

[12 of 12 positions shown; findings below may reference images not displayed]

EXAM:
ULTRASOUND-GUIDED BIOPSY OF A LEFT CERVICAL LYMPH NODE

MEDICATIONS:
None.

ANESTHESIA/SEDATION:
Moderate (conscious) sedation was employed during this procedure. A
total of Versed 1 mg was administered intravenously.

Moderate Sedation Time: 20 minutes. The patient's level of
consciousness and vital signs were monitored continuously by
radiology nursing throughout the procedure under my direct
supervision.

FLUOROSCOPY TIME:  NOT APPLICABLE

COMPLICATIONS:
None immediate.

PROCEDURE:
Informed written consent was obtained from the patient after a
thorough discussion of the procedural risks, benefits and
alternatives. All questions were addressed. Maximal Sterile Barrier
Technique was utilized including caps, mask, sterile gowns, sterile
gloves, sterile drape, hand hygiene and skin antiseptic. A timeout
was performed prior to the initiation of the procedure.

Patient was then brought to the ultrasound suite and prepped and
draped in the usual sterile manner. Utilizing 1% lidocaine as local
anesthetic and real-time ultrasound guidance a 17 gauge guiding
needle was placed percutaneously adjacent to the known left cervical
lymph node. The initial appearance of the node shows considerable
necrosis with a central viable core. Multiple core biopsies were
obtained of the more solid central component. Additionally some
fluid was expressed from the node which was also sent for pathologic
evaluation. Needle was removed without difficulty. Hemostasis
obtained at the puncture site. Patient tolerated the procedure well
and was returned his room in satisfactory condition.
IMPRESSION: Successful ultrasound-guided left cervical lymph node biopsy

## 2019-07-16 MED ORDER — ACETAMINOPHEN-CODEINE 300-30 MG PO TABS: 1.0000 | ORAL_TABLET | Freq: Three times a day (TID) | ORAL | 0 refills | Status: DC | PRN

## 2019-07-16 NOTE — Telephone Encounter (Signed)
Patient called cancer center requesting refill of Tylenol # 3.   As mandated by the [place] STOP Act (Strengthen Opioid Misuse Prevention), the [place] Controlled Substance Reporting System ([place]) was reviewed for this patient. Below is the past 94-months of controlled substance prescriptions as displayed by the registry. I have personally consulted with my supervising physician, Dr. Rogue Bussing, who agrees that continuation of opiate therapy is medically appropriate at this time and agrees to provide continual monitoring, including urine/blood drug screens, as indicated. Refill is appropriate on or after 07/12/2019.  NCCSRS reviewed: Reviewed and appropriate  Faythe Casa, NP 07/16/2019 12:30 PM [phone]

## 2019-07-17 ENCOUNTER — Other Ambulatory Visit: Payer: Self-pay | Admitting: *Deleted

## 2019-07-17 MED ORDER — ACETAMINOPHEN-CODEINE 300-30 MG PO TABS: 1.0000 | ORAL_TABLET | Freq: Three times a day (TID) | ORAL | 0 refills | Status: DC | PRN

## 2019-07-17 NOTE — Telephone Encounter (Signed)
[facility] does not have his pain medicine in stock and does not know when they can get it. She is requesting that it be sent to [address] please

## 2019-07-17 NOTE — Telephone Encounter (Signed)
Patient called cancer center requesting refill of Tylenol # 3 evrey 8 hours PRN for pain.  As mandated by the [place] STOP Act (Strengthen Opioid Misuse Prevention), the [place] Controlled Substance Reporting System ([employer]) was reviewed for this patient. Below is the past 89-months of controlled substance prescriptions as displayed by the registry. I have personally consulted with my supervising physician, Dr. Grayland Ormond who agrees that continuation of opiate therapy is medically appropriate at this time and agrees to provide continual monitoring, including urine/blood drug screens, as indicated. Refill is appropriate on or after 07/19/19.  NCCSRS reviewed: Reviewed and acceptable for refill.    Faythe Casa, NP 07/17/2019 2:09 PM [phone]

## 2019-08-06 ENCOUNTER — Other Ambulatory Visit: Payer: Self-pay | Admitting: *Deleted

## 2019-08-06 DIAGNOSIS — C779 Secondary and unspecified malignant neoplasm of lymph node, unspecified: Secondary | ICD-10-CM

## 2019-08-06 DIAGNOSIS — B370 Candidal stomatitis: Secondary | ICD-10-CM

## 2019-08-06 MED ORDER — NYSTATIN 100000 UNIT/ML MT SUSP: 5.0000 mL | Freq: Four times a day (QID) | OROMUCOSAL | 1 refills | Status: DC

## 2019-08-06 MED ORDER — ACETAMINOPHEN-CODEINE 300-30 MG PO TABS: 1.0000 | ORAL_TABLET | Freq: Three times a day (TID) | ORAL | 0 refills | Status: DC | PRN

## 2019-08-25 ENCOUNTER — Other Ambulatory Visit: Payer: Self-pay | Admitting: *Deleted

## 2019-08-25 MED ORDER — ACETAMINOPHEN-CODEINE 300-30 MG PO TABS: 1.0000 | ORAL_TABLET | Freq: Three times a day (TID) | ORAL | 0 refills | Status: DC | PRN

## 2019-08-25 NOTE — Telephone Encounter (Signed)
Wife called requesting a refill of his Tylenol #3 and to ask if there is a toothpaste or something that he can use to help him to be able to drink cold drinks, he has tried [employer]. Please advise

## 2019-08-25 NOTE — Telephone Encounter (Signed)
Ok, and not that I am aware of.

## 2019-09-04 ENCOUNTER — Encounter: Payer: Self-pay | Admitting: Radiation Oncology

## 2019-09-04 ENCOUNTER — Other Ambulatory Visit: Payer: Self-pay

## 2019-09-04 ENCOUNTER — Ambulatory Visit
Admission: RE | Admit: 2019-09-04 | Discharge: 2019-09-04 | Disposition: A | Discharge status: Home / Self Care | Payer: BC Managed Care – PPO | Source: Ambulatory Visit | Attending: Radiation Oncology | Admitting: Radiation Oncology

## 2019-09-04 VITALS — BP 130/86 | HR 69 | Temp 96.8°F | Resp 16 | Wt 189.9 lb

## 2019-09-04 DIAGNOSIS — C760 Malignant neoplasm of head, face and neck: Secondary | ICD-10-CM

## 2019-09-04 DIAGNOSIS — Z923 Personal history of irradiation: Secondary | ICD-10-CM | POA: Insufficient documentation

## 2019-09-04 DIAGNOSIS — R682 Dry mouth, unspecified: Secondary | ICD-10-CM | POA: Diagnosis not present

## 2019-09-04 DIAGNOSIS — Z8589 Personal history of malignant neoplasm of other organs and systems: Secondary | ICD-10-CM | POA: Diagnosis present

## 2019-09-04 NOTE — Progress Notes (Signed)
Radiation Oncology Follow up Note  Name: Dustin Hall   Date:   09/04/2019 MRN:  [id number] DOB: 06/25/1960    This 59 y.o. 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  REFERRING PROVIDER: Jodelle Green, FNP  HPI: Patient is a 59 year old male now out 10 months having completed concurrent chemoradiation therapy for p16 squamous cell carcinoma of unknown primary.  Seen today in routine follow-up he is doing well he still states his taste is off mouth is somewhat dry.  He is having no head and neck pain or dysphagia.  He had a PET scan back in September which showed no evidence of hypermetabolic activity compatible with complete response.  COMPLICATIONS OF TREATMENT: none  FOLLOW UP COMPLIANCE: keeps appointments   PHYSICAL EXAM:  BP 130/86 (BP Location: Right Arm, Patient Position: Sitting, Cuff Size: Normal)   Pulse 69   Temp (!) 96.8 F (36 C) (Tympanic)   Resp 16   Wt 189 lb 14.4 oz (86.1 kg)   BMI 25.76 kg/m  No evidence of cervical or supraclavicular adenopathy is detected.  Well-developed well-nourished patient in NAD. HEENT reveals PERLA, EOMI, discs not visualized.  Oral cavity is clear. No oral mucosal lesions are identified. Neck is clear without evidence of cervical or supraclavicular adenopathy. Lungs are clear to A&P. Cardiac examination is essentially unremarkable with regular rate and rhythm without murmur rub or thrill. Abdomen is benign with no organomegaly or masses noted. Motor sensory and DTR levels are equal and symmetric in the upper and lower extremities. Cranial nerves II through XII are grossly intact. Proprioception is intact. No peripheral adenopathy or edema is identified. No motor or sensory levels are noted. Crude visual fields are within normal range.  RADIOLOGY RESULTS: PET CT scan reviewed  compatible with above-stated findings  PLAN: Present time patient is now 10 months out from concurrent chemoradiation with no evidence of disease.  And pleased with his overall progress I have suggested some lozenges to help with his dry mouth.  I have also asked him to address that with ear nose and throat.  He continues close follow-up care with Dr. Tami Ribas.  I have asked to see him back in 1 year for follow-up.  Patient knows to call with any concerns.  I would like to take this opportunity to thank you for allowing me to participate in the care of your patient.Noreene Filbert, MD

## 2019-09-09 ENCOUNTER — Other Ambulatory Visit: Payer: Self-pay | Admitting: *Deleted

## 2019-09-09 MED ORDER — ACETAMINOPHEN-CODEINE 300-30 MG PO TABS: 1.0000 | ORAL_TABLET | Freq: Three times a day (TID) | ORAL | 0 refills | Status: DC | PRN

## 2019-10-01 ENCOUNTER — Other Ambulatory Visit: Payer: Self-pay | Admitting: *Deleted

## 2019-10-02 MED ORDER — ACETAMINOPHEN-CODEINE 300-30 MG PO TABS: 1.0000 | ORAL_TABLET | Freq: Three times a day (TID) | ORAL | 0 refills | Status: DC | PRN

## 2019-10-02 NOTE — Telephone Encounter (Signed)
RE: Pain medication  Patient will need to reach out to PCP for further refills of his Tylenol with codeine for pain management.  Faythe Casa, NP 10/02/2019 2:50 PM

## 2020-01-04 NOTE — Progress Notes (Signed)
[name]  [phone] [phone]  ID: Dustin Hall OB: 02-09-1961  MR#: [id number]  LYY#:[id number]  Patient Care Team: Jodelle Green, FNP as PCP - General (Family Medicine) Kate Sable, MD as PCP - Cardiology (Cardiology) Beverly Gust, MD (Otolaryngology) Lloyd Huger, MD as Consulting Physician (Oncology) Noreene Filbert, MD as Referring Physician (Radiation Oncology)  CHIEF COMPLAINT: Squamous cell cancer of the head and neck, unknown primary  INTERVAL HISTORY: Patient returns to clinic today for repeat laboratory work and routine 75-month evaluation.  He continues to have dry mouth and altered taste, but his weight has mildly improved.  He otherwise feels well.  He has no neurologic complaints. He denies any recent fevers or illnesses. He has no chest pain, shortness of breath, cough, or hemoptysis.  He denies any nausea, vomiting, constipation, or diarrhea.  He has no urinary complaints.  Patient offers no further specific complaints today.  REVIEW OF SYSTEMS:   Review of Systems  Constitutional: Negative.  Negative for fever, malaise/fatigue and weight loss.  HENT: Negative.  Negative for sore throat.        Dry mouth  Respiratory: Negative.  Negative for cough and hemoptysis.   Cardiovascular: Negative.  Negative for chest pain and leg swelling.  Gastrointestinal: Negative.  Negative for abdominal pain and nausea.  Genitourinary: Negative.  Negative for dysuria.  Musculoskeletal: Negative.  Negative for neck pain.  Skin: Negative.  Negative for rash.  Neurological: Negative.  Negative for dizziness, speech change, focal weakness, weakness and headaches.  Endo/Heme/Allergies: Does not bruise/bleed easily.  Psychiatric/Behavioral: Negative.  The patient is not nervous/anxious.     As per HPI. Otherwise, a complete review of systems is negative.  PAST MEDICAL HISTORY: Past Medical History:  Diagnosis Date  .  Cancer ([place])   . Coronary artery disease 12/2018   incidental finding: coronary calcum noted in LAD and RCA on NM PET scan    PAST SURGICAL HISTORY: Past Surgical History:  Procedure Laterality Date  . APPENDECTOMY    . THROAT SURGERY Right     FAMILY HISTORY: Family History  Problem Relation Age of Onset  . Aortic aneurysm Father     ADVANCED DIRECTIVES (Y/N):  N  HEALTH MAINTENANCE: Social History   Tobacco Use  . Smoking status: Never Smoker  . Smokeless tobacco: Current User    Types: Snuff  . Tobacco comment: [place] herbal snuff   Vaping Use  . Vaping Use: Never used  Substance Use Topics  . Alcohol use: Yes    Alcohol/week: 0.0 standard drinks    Comment: occasional  . Drug use: No     Colonoscopy:  PAP:  Bone density:  Lipid panel:  No Known Allergies  Current Outpatient Medications  Medication Sig Dispense Refill  . Acetaminophen-Codeine 300-30 MG tablet Take 1 tablet by mouth every 8 (eight) hours as needed for pain. 60 tablet 0  . aspirin EC 81 MG tablet Take 1 tablet (81 mg total) by mouth daily. 90 tablet 3  . atorvastatin (LIPITOR) 40 MG tablet Take 1 tablet (40 mg total) by mouth daily. 90 tablet 3  . Multiple Vitamin (MULTI-VITAMIN) tablet Take 1 tablet by mouth 1 day or 1 dose.    . nystatin (MYCOSTATIN) 100000 UNIT/ML suspension Take 5 mLs (500,000 Units total) by mouth 4 (four) times daily. Swish and swallow 200 mL 1  . omega-3 fish oil (MAXEPA) 1000 MG CAPS capsule Take 1 capsule by mouth daily.    Marland Kitchen  omeprazole (PRILOSEC) 20 MG capsule Take 20 mg by mouth daily.     No current facility-administered medications for this visit.    OBJECTIVE: Vitals:   01/08/20 1448  BP: 125/89  Pulse: (!) 54  Resp: 16  Temp: 97.6 F (36.4 C)  SpO2: 100%     Body mass index is 26 kg/m.    ECOG FS:0 - Asymptomatic  General: Well-developed, well-nourished, no acute distress. Eyes: Pink conjunctiva, anicteric sclera. HEENT: Normocephalic,  moist mucous membranes.  No palpable lymphadenopathy. Lungs: No audible wheezing or coughing. Heart: Regular rate and rhythm. Abdomen: Soft, nontender, no obvious distention. Musculoskeletal: No edema, cyanosis, or clubbing. Neuro: Alert, answering all questions appropriately. Cranial nerves grossly intact. Skin: No rashes or petechiae noted. Psych: Normal affect.  LAB RESULTS:  Lab Results  Component Value Date   NA 140 01/08/2020   K 4.4 01/08/2020   CL 104 01/08/2020   CO2 29 01/08/2020   GLUCOSE 102 (H) 01/08/2020   BUN 24 (H) 01/08/2020   CREATININE 0.85 01/08/2020   CALCIUM 9.3 01/08/2020   PROT 7.7 04/07/2019   ALBUMIN 4.4 04/07/2019   AST 20 04/07/2019   ALT 26 04/07/2019   ALKPHOS 78 04/07/2019   BILITOT 0.6 04/07/2019   GFRNONAA >60 01/08/2020   GFRAA >60 01/08/2020    Lab Results  Component Value Date   WBC 4.4 01/08/2020   NEUTROABS 2.8 01/08/2020   HGB 12.7 (L) 01/08/2020   HCT 36.9 (L) 01/08/2020   MCV 88.3 01/08/2020   PLT 135 (L) 01/08/2020     STUDIES: No results found.  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  Given patient's persistent pancytopenia, weight loss, and dysphasia, cisplatin was discontinued early and he received his fifth and final infusion on Sep 20, 2018.  PET scan results from January 02, 2019 reviewed independently with no obvious evidence of persistent or recurrent disease.  No further imaging is necessary unless there is concern of recurrence.  No further intervention is needed.  Continue follow-up with ENT as scheduled.  Return to clinic in 6 months for repeat laboratory and further evaluation. 2.  Dysphasia: Resolved. 3.  Weight loss: Patient's weight has remained stable and he has gained 2 pounds in the interim. 4.  Anxiety: Continue Xanax as needed. 5.  Pancytopenia:  Essentially resolved.  Patient continues to have a mild thrombocytopenia that is chronic and unchanged. 6.  Dry mouth/altered taste: Chronic and unchanged.  Secondary to XRT.  Continue symptomatic treatment as directed.  Patient expressed understanding and was in agreement with this plan. He also understands that He can call clinic at any time with any questions, concerns, or complaints.   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

## 2020-01-08 ENCOUNTER — Inpatient Hospital Stay: Payer: BC Managed Care – PPO | Admitting: Oncology

## 2020-01-08 ENCOUNTER — Other Ambulatory Visit: Payer: Self-pay

## 2020-01-08 ENCOUNTER — Encounter: Payer: Self-pay | Admitting: Oncology

## 2020-01-08 ENCOUNTER — Inpatient Hospital Stay: Payer: BC Managed Care – PPO | Attending: Oncology

## 2020-01-08 VITALS — BP 125/89 | HR 54 | Temp 97.6°F | Resp 16 | Wt 191.7 lb

## 2020-01-08 DIAGNOSIS — C770 Secondary and unspecified malignant neoplasm of lymph nodes of head, face and neck: Secondary | ICD-10-CM | POA: Insufficient documentation

## 2020-01-08 DIAGNOSIS — Z79899 Other long term (current) drug therapy: Secondary | ICD-10-CM | POA: Insufficient documentation

## 2020-01-08 DIAGNOSIS — C779 Secondary and unspecified malignant neoplasm of lymph node, unspecified: Secondary | ICD-10-CM

## 2020-01-08 DIAGNOSIS — I2510 Atherosclerotic heart disease of native coronary artery without angina pectoris: Secondary | ICD-10-CM | POA: Insufficient documentation

## 2020-01-08 DIAGNOSIS — Z923 Personal history of irradiation: Secondary | ICD-10-CM | POA: Insufficient documentation

## 2020-01-08 DIAGNOSIS — D696 Thrombocytopenia, unspecified: Secondary | ICD-10-CM | POA: Diagnosis not present

## 2020-01-08 DIAGNOSIS — Z8589 Personal history of malignant neoplasm of other organs and systems: Secondary | ICD-10-CM | POA: Diagnosis present

## 2020-01-08 DIAGNOSIS — C801 Malignant (primary) neoplasm, unspecified: Secondary | ICD-10-CM | POA: Diagnosis present

## 2020-01-08 DIAGNOSIS — F419 Anxiety disorder, unspecified: Secondary | ICD-10-CM | POA: Insufficient documentation

## 2020-01-08 DIAGNOSIS — Z08 Encounter for follow-up examination after completed treatment for malignant neoplasm: Secondary | ICD-10-CM | POA: Insufficient documentation

## 2020-01-08 DIAGNOSIS — R634 Abnormal weight loss: Secondary | ICD-10-CM | POA: Insufficient documentation

## 2020-01-08 DIAGNOSIS — R682 Dry mouth, unspecified: Secondary | ICD-10-CM | POA: Insufficient documentation

## 2020-01-08 DIAGNOSIS — Z7982 Long term (current) use of aspirin: Secondary | ICD-10-CM | POA: Insufficient documentation

## 2020-01-08 LAB — BASIC METABOLIC PANEL
Anion gap: 7 (ref 5–15)
BUN: 24 mg/dL — ABNORMAL HIGH (ref 6–20)
CO2: 29 mmol/L (ref 22–32)
Calcium: 9.3 mg/dL (ref 8.9–10.3)
Chloride: 104 mmol/L (ref 98–111)
Creatinine, Ser: 0.85 mg/dL (ref 0.61–1.24)
GFR calc Af Amer: >60 mL/min (ref >60)
GFR calc non Af Amer: >60 mL/min (ref >60)
Glucose, Bld: 102 mg/dL — ABNORMAL HIGH (ref 70–99)
Potassium: 4.4 mmol/L (ref 3.5–5.1)
Sodium: 140 mmol/L (ref 135–145)

## 2020-01-08 LAB — CBC WITH DIFFERENTIAL/PLATELET
Abs Immature Granulocytes: 0.01 10*3/uL (ref 0.00–0.07)
Basophils Absolute: 0 10*3/uL (ref 0.0–0.1)
Basophils Relative: 1 %
Eosinophils Absolute: 0.2 10*3/uL (ref 0.0–0.5)
Eosinophils Relative: 4 %
HCT: 36.9 % — ABNORMAL LOW (ref 39.0–52.0)
Hemoglobin: 12.7 g/dL — ABNORMAL LOW (ref 13.0–17.0)
Immature Granulocytes: 0 %
Lymphocytes Relative: 22 %
Lymphs Abs: 0.9 10*3/uL (ref 0.7–4.0)
MCH: 30.4 pg (ref 26.0–34.0)
MCHC: 34.4 g/dL (ref 30.0–36.0)
MCV: 88.3 fL (ref 80.0–100.0)
Monocytes Absolute: 0.4 10*3/uL (ref 0.1–1.0)
Monocytes Relative: 9 %
Neutro Abs: 2.8 10*3/uL (ref 1.7–7.7)
Neutrophils Relative %: 64 %
Platelets: 135 10*3/uL — ABNORMAL LOW (ref 150–400)
RBC: 4.18 MIL/uL — ABNORMAL LOW (ref 4.22–5.81)
RDW: 12.8 % (ref 11.5–15.5)
WBC: 4.4 10*3/uL (ref 4.0–10.5)
nRBC: 0 % (ref 0.0–0.2)

## 2020-01-08 NOTE — Progress Notes (Signed)
Patient denies any concerns today.  

## 2020-01-09 ENCOUNTER — Telehealth: Payer: Self-pay | Admitting: *Deleted

## 2020-01-09 LAB — THYROID PANEL WITH TSH
Free Thyroxine Index: 1.2 (ref 1.2–4.9)
T3 Uptake Ratio: 26 % (ref 24–39)
T4, Total: 4.6 ug/dL (ref 4.5–12.0)
TSH: 3.95 u[IU]/mL (ref 0.450–4.500)

## 2020-01-09 NOTE — Telephone Encounter (Signed)
I spoke with [facility], patient does not have follow up scheduled at this time. I attempted to schedule for patient but office stated that patient needed to reach out to schedule, left vm for patient with details.

## 2020-01-29 IMAGING — CT NUCLEAR MEDICINE PET IMAGE INITIAL (PI) SKULL BASE TO THIGH
1 of 10 series · 3 of 16 positions shown, 4 images · non-contrast
Comparison: CT neck 07/17/2018. Noncontrast abdominopelvic CT
09/13/2007.

CLINICAL DATA: Initial treatment strategy for metastatic non
keratinizing squamous cell carcinoma on left cervical node biopsy
07/23/2018.

EXAM:
NUCLEAR MEDICINE PET SKULL BASE TO THIGH
TECHNIQUE: 13.13 mCi F-18 FDG was injected intravenously. Full-ring PET imaging
was performed from the skull base to thigh after the radiotracer. CT
data was obtained and used for attenuation correction and anatomic
localization.
Fasting blood glucose: 123 mg/dl

[Series 3: ct wb 5.0 b30f · axial · 0.98mm/px · z∈[-1189,-205]mm · 3 of 329 slices shown, 4 images]
[im 1/329  soft-tissue]
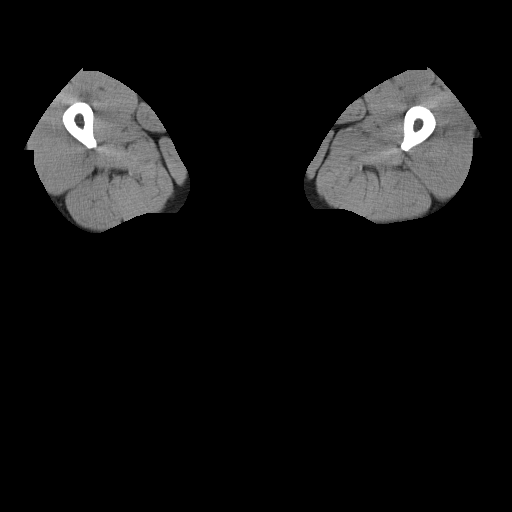
[im 1/329  bone]
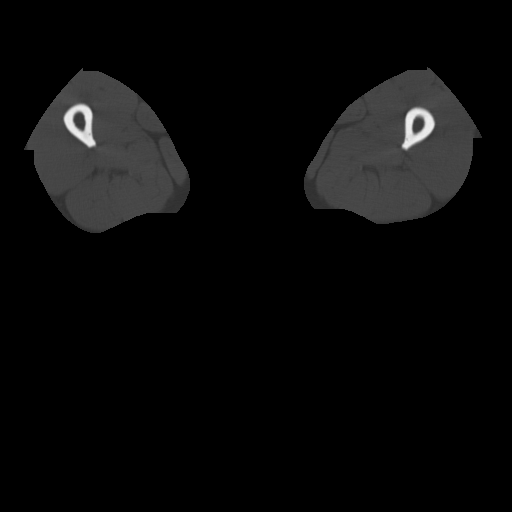
[im 165/329  soft-tissue]
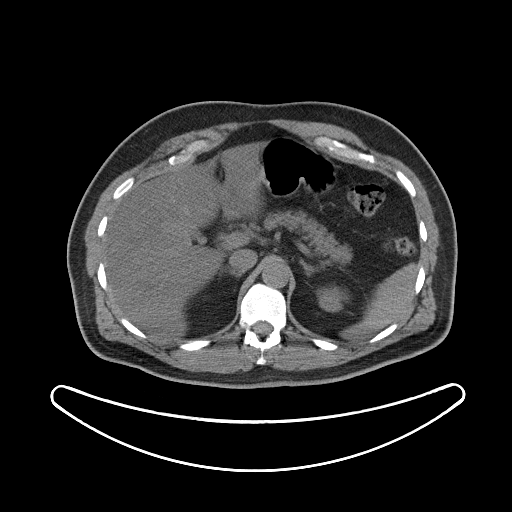
[im 329/329  soft-tissue]
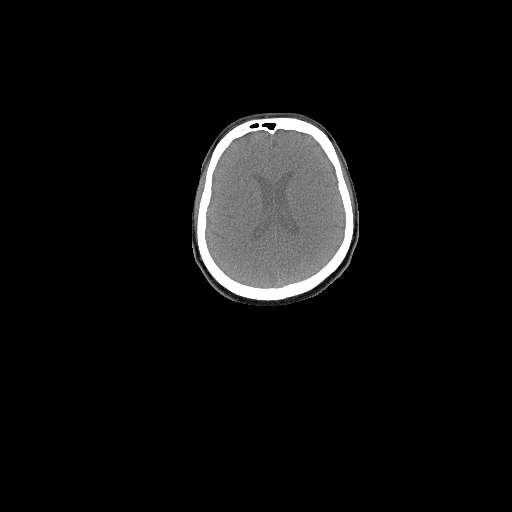

[3 of 16 positions shown; findings below may reference images not displayed]

FINDINGS: Mediastinal blood pool activity: SUV max

NECK:

The previously biopsied left level 2 node is not hypermetabolic.
This node measures 2.4 x 2.1 cm on image 47/3. Metabolic activity is
greatest inferiorly and near blood pool (SUV max 3.2).No obvious
primary malignancy is identified within the pharyngeal mucosal
space. Palatine tonsillar activity appears symmetric. A small focus
of activity in the floor of the right maxillary sinus appears
periodontal. There is mildly asymmetric activity along the right
aspect of the arytenoid cartilage (SUV max 5.3) without obvious
corresponding mucosal abnormality or cartilage destruction. There is
no abnormal salivary gland activity.

Incidental CT findings: none

CHEST:

There are no hypermetabolic mediastinal, hilar or axillary lymph
nodes. There is no hypermetabolic pulmonary activity.

Incidental CT findings: Three-vessel coronary artery
atherosclerosis. The lungs are clear.

ABDOMEN/PELVIS:

There is no hypermetabolic activity within the liver, adrenal
glands, spleen or pancreas. There is no hypermetabolic nodal
activity.

Incidental CT findings: Diffuse hepatic steatosis. Mild sigmoid
diverticulosis.

SKELETON:

There is no hypermetabolic activity to suggest osseous metastatic
disease.

Incidental CT findings: none
IMPRESSION: 1. There is no hypermetabolic activity associated with the biopsied
node in the left neck or elsewhere in the neck, chest, abdomen or
pelvis.
2. No obvious primary malignancy within the pharyngeal mucosal
space. Palatine tonsillar activity appears symmetric. There is a
small focus of asymmetric activity along the arytenoid cartilage on
the right without obvious mucosal lesion.
3. No abnormal metabolic activity in the chest, abdomen or pelvis.
4. Hepatic steatosis.

## 2020-02-12 ENCOUNTER — Other Ambulatory Visit: Payer: Self-pay

## 2020-02-12 MED ORDER — ATORVASTATIN CALCIUM 40 MG PO TABS: 40.0000 mg | ORAL_TABLET | Freq: Every day | ORAL | 0 refills | Status: DC

## 2020-02-12 NOTE — Telephone Encounter (Signed)
*  STAT* If patient is at the pharmacy, call can be transferred to refill team.   1. Which medications need to be refilled? (please list name of each medication and dose if known) Atorvastatin  2. Which pharmacy/location (including street and city if local pharmacy) is medication to be sent to?[facility]  3. Do they need a 30 day or 90 day supply? 90  

## 2020-03-15 ENCOUNTER — Telehealth: Payer: Self-pay | Admitting: Cardiology

## 2020-03-15 NOTE — Telephone Encounter (Signed)
Pt c/o medication issue:  1. Name of Medication: lipitor  2. How are you currently taking this medication (dosage and times per day)? 40 mg  3. Are you having a reaction (difficulty breathing--STAT)?   4. What is your medication issue? Patient is very achey and stiff

## 2020-03-29 ENCOUNTER — Telehealth: Payer: Self-pay

## 2020-03-29 MED ORDER — PRAVASTATIN SODIUM 20 MG PO TABS: 20.0000 mg | ORAL_TABLET | Freq: Every day | ORAL | 3 refills | Status: DC

## 2020-03-29 NOTE — Telephone Encounter (Signed)
Spoke with patients wife regarding patients stiffness and aches related to Lipitor. Dr. Garen Lah recommended that we try Pravachol 20MG  M/W/F for one month and then go to 20 MG five times a week, and then if patient is tolerating that move on to 20 MG daily. If patient is unable to tolerate this, we will try Zetia.10 MG.  Patients wife verbalized understanding and agreed with plan.

## 2020-03-29 NOTE — Telephone Encounter (Signed)
Patient wife on DPR calling to check status of advise.  Please call.

## 2020-03-29 NOTE — Telephone Encounter (Signed)
See telephone encounter for 03/29/20.

## 2020-05-11 ENCOUNTER — Other Ambulatory Visit: Payer: BC Managed Care – PPO

## 2020-05-12 ENCOUNTER — Other Ambulatory Visit: Payer: BC Managed Care – PPO

## 2020-05-12 ENCOUNTER — Other Ambulatory Visit: Payer: Self-pay

## 2020-05-12 DIAGNOSIS — Z20822 Contact with and (suspected) exposure to covid-19: Secondary | ICD-10-CM

## 2020-05-14 LAB — NOVEL CORONAVIRUS, NAA: SARS-CoV-2, NAA: DETECTED — AB

## 2020-05-14 LAB — SARS-COV-2, NAA 2 DAY TAT

## 2020-05-17 ENCOUNTER — Telehealth: Payer: Self-pay

## 2020-05-17 NOTE — Telephone Encounter (Signed)
Called to discuss with patient about COVID-19 symptoms and the use of one of the available treatments for those with mild to moderate Covid symptoms and at a high risk of hospitalization.  Pt appears to qualify for outpatient treatment due to co-morbid conditions and/or a member of an at-risk group in accordance with the FDA Emergency Use Authorization.    Symptom onset: Unknown Vaccinated: Yes Booster? Unknown Immunocompromised? No Qualifiers: CAD per chart  Unable to reach pt - Message left with call back number [phone].   Marcello Moores

## 2020-06-07 ENCOUNTER — Telehealth: Payer: Self-pay | Admitting: Cardiology

## 2020-06-07 NOTE — Telephone Encounter (Signed)
Multiple attempts to schedule appt.  Recall in place.  Closing encounter.

## 2020-06-07 NOTE — Telephone Encounter (Signed)
-----   Message from Kavin Leech, RN sent at 06/01/2020  3:19 PM EST ----- Regarding: 1 year f/u This patient is due for his one year follow up. Can you please schedule.  Thanks! Coleen [place] RN

## 2020-06-21 ENCOUNTER — Other Ambulatory Visit: Payer: Self-pay

## 2020-06-21 MED ORDER — PRAVASTATIN SODIUM 20 MG PO TABS: 20.0000 mg | ORAL_TABLET | Freq: Every day | ORAL | 0 refills | Status: DC

## 2020-07-09 ENCOUNTER — Other Ambulatory Visit: Payer: Self-pay | Admitting: *Deleted

## 2020-07-09 DIAGNOSIS — C779 Secondary and unspecified malignant neoplasm of lymph node, unspecified: Secondary | ICD-10-CM

## 2020-07-12 ENCOUNTER — Inpatient Hospital Stay: Payer: BC Managed Care – PPO | Attending: Oncology

## 2020-07-12 ENCOUNTER — Other Ambulatory Visit: Payer: Self-pay

## 2020-07-12 ENCOUNTER — Inpatient Hospital Stay (HOSPITAL_BASED_OUTPATIENT_CLINIC_OR_DEPARTMENT_OTHER): Payer: BC Managed Care – PPO | Admitting: Oncology

## 2020-07-12 VITALS — BP 119/89 | HR 65 | Temp 97.9°F | Resp 18 | Wt 198.4 lb

## 2020-07-12 DIAGNOSIS — D649 Anemia, unspecified: Secondary | ICD-10-CM | POA: Insufficient documentation

## 2020-07-12 DIAGNOSIS — R946 Abnormal results of thyroid function studies: Secondary | ICD-10-CM | POA: Diagnosis not present

## 2020-07-12 DIAGNOSIS — Z79899 Other long term (current) drug therapy: Secondary | ICD-10-CM | POA: Diagnosis not present

## 2020-07-12 DIAGNOSIS — C779 Secondary and unspecified malignant neoplasm of lymph node, unspecified: Secondary | ICD-10-CM

## 2020-07-12 DIAGNOSIS — C7989 Secondary malignant neoplasm of other specified sites: Secondary | ICD-10-CM | POA: Insufficient documentation

## 2020-07-12 DIAGNOSIS — Z7982 Long term (current) use of aspirin: Secondary | ICD-10-CM | POA: Diagnosis not present

## 2020-07-12 DIAGNOSIS — Z9221 Personal history of antineoplastic chemotherapy: Secondary | ICD-10-CM | POA: Diagnosis not present

## 2020-07-12 DIAGNOSIS — C801 Malignant (primary) neoplasm, unspecified: Secondary | ICD-10-CM

## 2020-07-12 DIAGNOSIS — Z923 Personal history of irradiation: Secondary | ICD-10-CM | POA: Insufficient documentation

## 2020-07-12 LAB — CBC WITH DIFFERENTIAL/PLATELET
Abs Immature Granulocytes: 0.01 10*3/uL (ref 0.00–0.07)
Basophils Absolute: 0 10*3/uL (ref 0.0–0.1)
Basophils Relative: 1 %
Eosinophils Absolute: 0.1 10*3/uL (ref 0.0–0.5)
Eosinophils Relative: 2 %
HCT: 37.7 % — ABNORMAL LOW (ref 39.0–52.0)
Hemoglobin: 12.5 g/dL — ABNORMAL LOW (ref 13.0–17.0)
Immature Granulocytes: 0 %
Lymphocytes Relative: 24 %
Lymphs Abs: 0.9 10*3/uL (ref 0.7–4.0)
MCH: 29.3 pg (ref 26.0–34.0)
MCHC: 33.2 g/dL (ref 30.0–36.0)
MCV: 88.3 fL (ref 80.0–100.0)
Monocytes Absolute: 0.4 10*3/uL (ref 0.1–1.0)
Monocytes Relative: 10 %
Neutro Abs: 2.4 10*3/uL (ref 1.7–7.7)
Neutrophils Relative %: 63 %
Platelets: 151 10*3/uL (ref 150–400)
RBC: 4.27 MIL/uL (ref 4.22–5.81)
RDW: 13.3 % (ref 11.5–15.5)
WBC: 3.8 10*3/uL — ABNORMAL LOW (ref 4.0–10.5)
nRBC: 0 % (ref 0.0–0.2)

## 2020-07-12 LAB — BASIC METABOLIC PANEL
Anion gap: 10 (ref 5–15)
BUN: 22 mg/dL — ABNORMAL HIGH (ref 6–20)
CO2: 26 mmol/L (ref 22–32)
Calcium: 9.1 mg/dL (ref 8.9–10.3)
Chloride: 101 mmol/L (ref 98–111)
Creatinine, Ser: 0.94 mg/dL (ref 0.61–1.24)
GFR, Estimated: >60 mL/min (ref >60)
Glucose, Bld: 101 mg/dL — ABNORMAL HIGH (ref 70–99)
Potassium: 4.2 mmol/L (ref 3.5–5.1)
Sodium: 137 mmol/L (ref 135–145)

## 2020-07-12 NOTE — Progress Notes (Signed)
Dustin Hall  Telephone:([phone]) [phone] Fax:(336) [phone]  ID: Dustin Hall OB: 03/16/1961  MR#: [id number]  TGG#:[id number]  Patient Care Team: Jodelle Green, FNP as PCP - General (Family Medicine) Kate Sable, MD as PCP - Cardiology (Cardiology) Beverly Gust, MD (Otolaryngology) Lloyd Huger, MD as Consulting Physician (Oncology) Noreene Filbert, MD as Referring Physician (Radiation Oncology)  CHIEF COMPLAINT: Squamous cell cancer of the head and neck, unknown primary  INTERVAL HISTORY: Patient returns to clinic today for repeat laboratory work and routine 10-month evaluation.  He was last seen in clinic on 01/08/2020.  In the interim, he has done well.  He denies any new concerns.  He was seen by ENT and was scoped without evidence of recurrence of disease.  He continues to have dry mouth and altered taste but otherwise is doing well.  His weight is stable.  His appetite is good.  He has trouble swallowing some solid foods.  He denies any recent fevers or illness.  Denies chest pain, shortness of breath cough or hemoptysis.  Denies nausea, vomiting, constipation or diarrhea.  Denies any new lumps or bumps.  Reports dry skin from radiation that has improved.  Did recently have shingles outbreak on his left arm which he treated himself with calamine lotion.  REVIEW OF SYSTEMS:   Review of Systems  Constitutional: Negative.  Negative for fever, malaise/fatigue and weight loss.  HENT: Negative.  Negative for sore throat.        Dry mouth  Respiratory: Negative.  Negative for cough and hemoptysis.   Cardiovascular: Negative.  Negative for chest pain and leg swelling.  Gastrointestinal: Negative.  Negative for abdominal pain and nausea.  Genitourinary: Negative.  Negative for dysuria.  Musculoskeletal: Positive for myalgias. Negative for neck pain.  Skin: Positive for itching and rash (Left arm ).  Neurological: Negative.  Negative for dizziness, speech  change, focal weakness, weakness and headaches.  Endo/Heme/Allergies: Does not bruise/bleed easily.  Psychiatric/Behavioral: Negative.  The patient is not nervous/anxious.     As per HPI. Otherwise, a complete review of systems is negative.  PAST MEDICAL HISTORY: Past Medical History:  Diagnosis Date  . Cancer ([place])   . Coronary artery disease 12/2018   incidental finding: coronary calcum noted in LAD and RCA on NM PET scan    PAST SURGICAL HISTORY: Past Surgical History:  Procedure Laterality Date  . APPENDECTOMY    . THROAT SURGERY Right     FAMILY HISTORY: Family History  Problem Relation Age of Onset  . Aortic aneurysm Father     ADVANCED DIRECTIVES (Y/N):  N  HEALTH MAINTENANCE: Social History   Tobacco Use  . Smoking status: Never Smoker  . Smokeless tobacco: Current User    Types: Snuff  . Tobacco comment: [place] herbal snuff   Vaping Use  . Vaping Use: Never used  Substance Use Topics  . Alcohol use: Yes    Alcohol/week: 0.0 standard drinks    Comment: occasional  . Drug use: No     Colonoscopy:  PAP:  Bone density:  Lipid panel:  No Known Allergies  Current Outpatient Medications  Medication Sig Dispense Refill  . aspirin EC 81 MG tablet Take 1 tablet (81 mg total) by mouth daily. 90 tablet 3  . Multiple Vitamin (MULTI-VITAMIN) tablet Take 1 tablet by mouth 1 day or 1 dose.    . omega-3 fish oil (MAXEPA) 1000 MG CAPS capsule Take 1 capsule by mouth daily.    Marland Kitchen  SODIUM FLUORIDE 5000 ENAMEL 1.1-5 % GEL     . Acetaminophen-Codeine 300-30 MG tablet Take 1 tablet by mouth every 8 (eight) hours as needed for pain. (Patient not taking: Reported on 07/12/2020) 60 tablet 0  . omeprazole (PRILOSEC) 20 MG capsule Take 20 mg by mouth daily. (Patient not taking: Reported on 07/12/2020)    . pravastatin (PRAVACHOL) 20 MG tablet Take 1 tablet (20 mg total) by mouth daily. Please schedule office visit for further refills. Thank you! (Patient not taking:  Reported on 07/12/2020) 30 tablet 0   No current facility-administered medications for this visit.    OBJECTIVE: Vitals:   07/12/20 1431  BP: 119/89  Pulse: 65  Resp: 18  Temp: 97.9 F (36.6 C)  SpO2: 100%     Body mass index is 26.9 kg/m.    ECOG FS:0 - Asymptomatic  Physical Exam Constitutional:      Appearance: Normal appearance.  HENT:     Head: Normocephalic and atraumatic.  Eyes:     Pupils: Pupils are equal, round, and reactive to light.  Cardiovascular:     Rate and Rhythm: Normal rate and regular rhythm.     Heart sounds: Normal heart sounds. No murmur heard.   Pulmonary:     Effort: Pulmonary effort is normal.     Breath sounds: Normal breath sounds. No wheezing.  Abdominal:     General: Bowel sounds are normal. There is no distension.     Palpations: Abdomen is soft.     Tenderness: There is no abdominal tenderness.  Musculoskeletal:        General: Normal range of motion.     Cervical back: Normal range of motion.  Skin:    General: Skin is warm and dry.     Findings: No rash.  Neurological:     Mental Status: He is alert and oriented to person, place, and time.  Psychiatric:        Judgment: Judgment normal.     LAB RESULTS:  Lab Results  Component Value Date   NA 137 07/12/2020   K 4.2 07/12/2020   CL 101 07/12/2020   CO2 26 07/12/2020   GLUCOSE 101 (H) 07/12/2020   BUN 22 (H) 07/12/2020   CREATININE 0.94 07/12/2020   CALCIUM 9.1 07/12/2020   PROT 7.7 04/07/2019   ALBUMIN 4.4 04/07/2019   AST 20 04/07/2019   ALT 26 04/07/2019   ALKPHOS 78 04/07/2019   BILITOT 0.6 04/07/2019   GFRNONAA >60 07/12/2020   GFRAA >60 01/08/2020    Lab Results  Component Value Date   WBC 3.8 (L) 07/12/2020   NEUTROABS 2.4 07/12/2020   HGB 12.5 (L) 07/12/2020   HCT 37.7 (L) 07/12/2020   MCV 88.3 07/12/2020   PLT 151 07/12/2020     STUDIES: No results found.  ASSESSMENT: Squamous cell cancer of the head and neck, unknown primary, P16+  PLAN:     1. Squamous cell cancer of the head and neck, unknown primary, P16+:   -Completed XRT on 10/16/2018. -Received weekly cisplatin which was discontinued early secondary to persistent pancytopenia, weight loss and dysphagia.  His last infusion was on 09/20/2018. -Most recent PET scan from 01/02/2019 did not reveal any evidence of recurrent or metastatic disease. -No additional imaging is needed unless clinically indicated. -Continue yearly follow-up with ENT. -Labs from 07/12/2020 are fairly unremarkable.  2.  Anemia: -Hemoglobin from 07/12/2020 is 12.5.  MCV is 88.3. -Per chart review he has been anemic since May 2020. -  If anemia is persistent, would recommend additional work-up with iron, vitamin B12, folate, copper and ferritin.  Disposition: -RTC in 6 months with repeat labs and further evaluation.  Patient expressed understanding and was in agreement with this plan. He also understands that He can call clinic at any time with any questions, concerns, or complaints.   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

## 2020-07-13 LAB — THYROID PANEL WITH TSH
Free Thyroxine Index: 1.1 — ABNORMAL LOW (ref 1.2–4.9)
T3 Uptake Ratio: 27 % (ref 24–39)
T4, Total: 4.1 ug/dL — ABNORMAL LOW (ref 4.5–12.0)
TSH: 4.86 u[IU]/mL — ABNORMAL HIGH (ref 0.450–4.500)

## 2020-09-03 ENCOUNTER — Other Ambulatory Visit: Payer: Self-pay | Admitting: *Deleted

## 2020-09-03 ENCOUNTER — Encounter: Payer: Self-pay | Admitting: Radiation Oncology

## 2020-09-03 ENCOUNTER — Ambulatory Visit
Admission: RE | Admit: 2020-09-03 | Discharge: 2020-09-03 | Disposition: A | Discharge status: Home / Self Care | Payer: BC Managed Care – PPO | Source: Ambulatory Visit | Attending: Radiation Oncology | Admitting: Radiation Oncology

## 2020-09-03 VITALS — BP 119/77 | HR 76 | Temp 98.0°F | Resp 16 | Wt 202.4 lb

## 2020-09-03 DIAGNOSIS — Z8589 Personal history of malignant neoplasm of other organs and systems: Secondary | ICD-10-CM | POA: Insufficient documentation

## 2020-09-03 DIAGNOSIS — R1310 Dysphagia, unspecified: Secondary | ICD-10-CM | POA: Diagnosis not present

## 2020-09-03 DIAGNOSIS — Z9221 Personal history of antineoplastic chemotherapy: Secondary | ICD-10-CM | POA: Diagnosis not present

## 2020-09-03 DIAGNOSIS — C760 Malignant neoplasm of head, face and neck: Secondary | ICD-10-CM

## 2020-09-03 DIAGNOSIS — R682 Dry mouth, unspecified: Secondary | ICD-10-CM | POA: Insufficient documentation

## 2020-09-03 DIAGNOSIS — Z923 Personal history of irradiation: Secondary | ICD-10-CM | POA: Insufficient documentation

## 2020-09-03 NOTE — Progress Notes (Signed)
Radiation Oncology Follow up Note  Name: Dustin Hall   Date:   09/03/2020 MRN:  [id number] DOB: July 31, 1960    This 60 y.o. 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  REFERRING PROVIDER: Jodelle Green, FNP  HPI: Patient is a 60 year old male now about 2 years having completed concurrent.  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  Also his taste is still altered mostly to sweets.  He is put back on most of his weight specifically denies any head and neck pain.  He has not had a CT scan since treatment.  He is also not been followed by ear nose and throat.  COMPLICATIONS OF TREATMENT: none  FOLLOW UP COMPLIANCE: keeps appointments   PHYSICAL EXAM:  BP 119/77   Pulse 76   Temp 98 F (36.7 C) (Tympanic)   Resp 16   Wt 202 lb 6.4 oz (91.8 kg)   BMI 27.45 kg/m  No evidence of oral mucosal Koza lesions are noted.  Neck is clear without evidence of subdigastric cervical or supraclavicular adenopathy.  Well-developed well-nourished patient in NAD. HEENT reveals PERLA, EOMI, discs not visualized.  Oral cavity is clear. No oral mucosal lesions are identified. Neck is clear without evidence of cervical or supraclavicular adenopathy. Lungs are clear to A&P. Cardiac examination is essentially unremarkable with regular rate and rhythm without murmur rub or thrill. Abdomen is benign with no organomegaly or masses noted. Motor sensory and DTR levels are equal and symmetric in the upper and lower extremities. Cranial nerves II through XII are grossly intact. Proprioception is intact. No peripheral adenopathy or edema is identified. No motor or sensory levels are noted. Crude visual fields are  within normal range. RADIOLOGY RESULTS: CT scan of the soft tissue head and neck ordered  PLAN: At this time of ordered a CT scan of the head and neck in 6 months.  We will have that review at his next follow-up appointment in 6 months.  Otherwise patient is clinically doing well 2 years out from concurrent chemoradiation therapy for unknown head and neck primary.  Patient knows to call with any concerns.  I would like to take this opportunity to thank you for allowing me to participate in the care of your patient.Noreene Filbert, MD

## 2020-10-29 ENCOUNTER — Telehealth: Payer: Self-pay | Admitting: Cardiology

## 2020-10-29 NOTE — Telephone Encounter (Signed)
Patient has been contacted at least 3 times for a recall, recall has been removed

## 2021-01-10 ENCOUNTER — Encounter: Payer: Self-pay | Admitting: Oncology

## 2021-01-10 ENCOUNTER — Inpatient Hospital Stay (HOSPITAL_BASED_OUTPATIENT_CLINIC_OR_DEPARTMENT_OTHER): Payer: BC Managed Care – PPO | Admitting: Oncology

## 2021-01-10 ENCOUNTER — Inpatient Hospital Stay: Payer: BC Managed Care – PPO | Attending: Oncology

## 2021-01-10 VITALS — BP 132/82 | HR 73 | Temp 96.8°F | Resp 16 | Wt 203.5 lb

## 2021-01-10 DIAGNOSIS — Z79899 Other long term (current) drug therapy: Secondary | ICD-10-CM | POA: Insufficient documentation

## 2021-01-10 DIAGNOSIS — D649 Anemia, unspecified: Secondary | ICD-10-CM

## 2021-01-10 DIAGNOSIS — Z9221 Personal history of antineoplastic chemotherapy: Secondary | ICD-10-CM | POA: Diagnosis not present

## 2021-01-10 DIAGNOSIS — Z923 Personal history of irradiation: Secondary | ICD-10-CM | POA: Insufficient documentation

## 2021-01-10 DIAGNOSIS — C801 Malignant (primary) neoplasm, unspecified: Secondary | ICD-10-CM | POA: Diagnosis present

## 2021-01-10 DIAGNOSIS — C779 Secondary and unspecified malignant neoplasm of lymph node, unspecified: Secondary | ICD-10-CM

## 2021-01-10 DIAGNOSIS — C7989 Secondary malignant neoplasm of other specified sites: Secondary | ICD-10-CM | POA: Insufficient documentation

## 2021-01-10 LAB — CBC WITH DIFFERENTIAL/PLATELET
Abs Immature Granulocytes: 0.02 10*3/uL (ref 0.00–0.07)
Basophils Absolute: 0 10*3/uL (ref 0.0–0.1)
Basophils Relative: 0 %
Eosinophils Absolute: 0.1 10*3/uL (ref 0.0–0.5)
Eosinophils Relative: 2 %
HCT: 37.6 % — ABNORMAL LOW (ref 39.0–52.0)
Hemoglobin: 12.5 g/dL — ABNORMAL LOW (ref 13.0–17.0)
Immature Granulocytes: 0 %
Lymphocytes Relative: 17 %
Lymphs Abs: 0.8 10*3/uL (ref 0.7–4.0)
MCH: 29.7 pg (ref 26.0–34.0)
MCHC: 33.2 g/dL (ref 30.0–36.0)
MCV: 89.3 fL (ref 80.0–100.0)
Monocytes Absolute: 0.3 10*3/uL (ref 0.1–1.0)
Monocytes Relative: 7 %
Neutro Abs: 3.5 10*3/uL (ref 1.7–7.7)
Neutrophils Relative %: 74 %
Platelets: 170 10*3/uL (ref 150–400)
RBC: 4.21 MIL/uL — ABNORMAL LOW (ref 4.22–5.81)
RDW: 13.2 % (ref 11.5–15.5)
WBC: 4.7 10*3/uL (ref 4.0–10.5)
nRBC: 0 % (ref 0.0–0.2)

## 2021-01-10 LAB — COMPREHENSIVE METABOLIC PANEL
ALT: 27 U/L (ref 0–44)
AST: 30 U/L (ref 15–41)
Albumin: 4.5 g/dL (ref 3.5–5.0)
Alkaline Phosphatase: 78 U/L (ref 38–126)
Anion gap: 8 (ref 5–15)
BUN: 17 mg/dL (ref 6–20)
CO2: 29 mmol/L (ref 22–32)
Calcium: 9.3 mg/dL (ref 8.9–10.3)
Chloride: 101 mmol/L (ref 98–111)
Creatinine, Ser: 0.83 mg/dL (ref 0.61–1.24)
GFR, Estimated: >60 mL/min (ref >60)
Glucose, Bld: 124 mg/dL — ABNORMAL HIGH (ref 70–99)
Potassium: 4.1 mmol/L (ref 3.5–5.1)
Sodium: 138 mmol/L (ref 135–145)
Total Bilirubin: 0.9 mg/dL (ref 0.3–1.2)
Total Protein: 7.5 g/dL (ref 6.5–8.1)

## 2021-01-10 LAB — IRON AND TIBC
Iron: 65 ug/dL (ref 45–182)
Saturation Ratios: 16 % — ABNORMAL LOW (ref 17.9–39.5)
TIBC: 407 ug/dL (ref 250–450)
UIBC: 342 ug/dL

## 2021-01-10 LAB — VITAMIN B12: Vitamin B-12: 510 pg/mL (ref 180–914)

## 2021-01-10 LAB — FERRITIN: Ferritin: 100 ng/mL (ref 24–336)

## 2021-01-10 LAB — FOLATE: Folate: 28 ng/mL (ref >5.9)

## 2021-01-10 NOTE — Progress Notes (Signed)
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  He is followed Dr. Grayland Ormond and is here for his 49-monthevaluation.  Interval history-patient reports doing well since his last visit.  He saw Dr. MTami Ribasabout 2 months ago.  He has stable dry mouth and dysphagia typically with large hard pieces of food.  Saw Dr. CDonella Stadein May 2022 and is scheduled for a CT scan in November.  Reports muscle aches typically in his shoulders and biceps.  States he has not been working out so is unsure of the cause.  This occurs intermittently over the past few months.  Denies any new medications.  Appetite is good.   REVIEW OF SYSTEMS:   Review of Systems  Constitutional: Negative.  Negative for chills, fever, malaise/fatigue and weight loss.  HENT:  Negative for congestion, ear pain and tinnitus.   Eyes: Negative.  Negative for blurred vision and double vision.  Respiratory: Negative.  Negative for cough, sputum production and shortness of breath.   Cardiovascular: Negative.  Negative for chest pain, palpitations and leg swelling.  Gastrointestinal: Negative.  Negative for abdominal pain, constipation, diarrhea, nausea and vomiting.       Dry mouth and dysphagia  Genitourinary:  Negative for dysuria, frequency and urgency.  Musculoskeletal:  Positive for myalgias. Negative for  back pain and falls.  Skin: Negative.  Negative for rash.  Neurological: Negative.  Negative for weakness and headaches.  Endo/Heme/Allergies: Negative.  Does not bruise/bleed easily.  Psychiatric/Behavioral: Negative.  Negative for depression. The patient is not nervous/anxious and does not have insomnia.    As per HPI. Otherwise, a complete review of systems is negative.  PAST MEDICAL HISTORY: Past Medical History:  Diagnosis Date   Cancer ([facility]    Coronary artery disease 12/2018   incidental finding: coronary calcum noted in LAD and RCA on NM PET scan    PAST SURGICAL HISTORY: Past Surgical History:  Procedure Laterality Date   APPENDECTOMY     THROAT SURGERY Right     FAMILY HISTORY: Family History  Problem Relation Age of Onset   Aortic aneurysm Father     ADVANCED DIRECTIVES (Y/N):  N  HEALTH MAINTENANCE: Social History   Tobacco Use   Smoking status: Never   Smokeless tobacco: Current    Types: Snuff   Tobacco comments:    [place] herbal snuff   Vaping Use   Vaping Use: Never used  Substance Use Topics   Alcohol use: Yes    Alcohol/week: 0.0 standard drinks    Comment: occasional   Drug use: No     Colonoscopy:  PAP:  Bone density:  Lipid panel:  No Known Allergies  Current Outpatient Medications  Medication Sig Dispense Refill   aspirin EC 81 MG tablet Take 1 tablet (81 mg total) by mouth daily. 90 tablet 3   Multiple Vitamin (MULTI-VITAMIN) tablet Take 1 tablet by mouth 1 day or 1 dose.  omega-3 fish oil (MAXEPA) 1000 MG CAPS capsule Take 1 capsule by mouth daily.     SODIUM FLUORIDE 5000 ENAMEL 1.1-5 % GEL      Acetaminophen-Codeine 300-30 MG tablet Take 1 tablet by mouth every 8 (eight) hours as needed for pain. (Patient not taking: Reported on 01/10/2021) 60 tablet 0   pravastatin (PRAVACHOL) 20 MG tablet Take 1 tablet (20 mg total) by mouth daily. Please schedule office visit for further refills. Thank you! (Patient not taking: No  sig reported) 30 tablet 0   No current facility-administered medications for this visit.    OBJECTIVE: Vitals:   01/10/21 1308  BP: 132/82  Pulse: 73  Resp: 16  Temp: (!) 96.8 F (36 C)     Body mass index is 27.6 kg/m.    ECOG FS:0 - Asymptomatic  Physical Exam Constitutional:      Appearance: Normal appearance.  HENT:     Head: Normocephalic and atraumatic.  Eyes:     Pupils: Pupils are equal, round, and reactive to light.  Cardiovascular:     Rate and Rhythm: Normal rate and regular rhythm.     Heart sounds: Normal heart sounds. No murmur heard. Pulmonary:     Effort: Pulmonary effort is normal.     Breath sounds: Normal breath sounds. No wheezing.  Abdominal:     General: Bowel sounds are normal. There is no distension.     Palpations: Abdomen is soft.     Tenderness: There is no abdominal tenderness.  Musculoskeletal:        General: Normal range of motion.     Cervical back: Normal range of motion.  Skin:    General: Skin is warm and dry.     Findings: No rash.  Neurological:     Mental Status: He is alert and oriented to person, place, and time.  Psychiatric:        Judgment: Judgment normal.    LAB RESULTS:  Lab Results  Component Value Date   NA 138 01/10/2021   K 4.1 01/10/2021   CL 101 01/10/2021   CO2 29 01/10/2021   GLUCOSE 124 (H) 01/10/2021   BUN 17 01/10/2021   CREATININE 0.83 01/10/2021   CALCIUM 9.3 01/10/2021   PROT 7.5 01/10/2021   ALBUMIN 4.5 01/10/2021   AST 30 01/10/2021   ALT 27 01/10/2021   ALKPHOS 78 01/10/2021   BILITOT 0.9 01/10/2021   GFRNONAA >60 01/10/2021   GFRAA >60 01/08/2020    Lab Results  Component Value Date   WBC 4.7 01/10/2021   NEUTROABS 3.5 01/10/2021   HGB 12.5 (L) 01/10/2021   HCT 37.6 (L) 01/10/2021   MCV 89.3 01/10/2021   PLT 170 01/10/2021     STUDIES: No results found.  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 Squamous cell cancer of the  head and neck, unknown primary, P16+:   He received weekly cisplatin which was discontinued early secondary to persistent pancytopenia, weight loss and dysphagia.  His last infusion was on 09/20/2018.  Completed XRT on 10/16/2018.  PET scan from 01/02/2019 did not reveal any evidence of recurrent or metastatic disease.  He is scheduled for a CT CAP in November.  He follows up with ENT annually and radiation oncology every 6 months. Labs from 01/10/2021 are unremarkable except for mild anemia.  2.  Anemia: Hemoglobin from 01/10/2021 is 12.5 which is stable from previous.  Work-up for anemia included  a normal B12, ferritin with a low iron saturation 16%.  Copper and MMA are pending during dictation.  Patient could try iron supplements once daily if he is able to tolerate to see if this would improve his anemia.  3.  Bilateral shoulder and bicep soreness: Continue to monitor.  Unclear etiology.  Could be medication induced.  Recommend he touch base with his PCP regarding cholesterol medication.  Disposition: RTC in 6 months with repeat lab work (CBC, CMP, ferritin and iron panel) and further evaluation with Dr. Grayland Ormond.  I spent 25 minutes dedicated to the care of this patient (face-to-face and non-face-to-face) on the date of the encounter to include what is described in the assessment and plan.   Cancer Staging 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 [facility]) Staging form: Oral Cavity, AJCC 8th Edition - Clinical stage from 08/02/2018: Stage Unknown (cTX, cN2a, cM0) - Signed by Lloyd Huger, MD on 08/02/2018 P16 overexpression: Positive   Jacquelin Hawking, NP   01/11/2021 5:14 AM

## 2021-01-10 NOTE — Progress Notes (Signed)
Patient denies new problems/concerns today.   °

## 2021-01-11 ENCOUNTER — Encounter: Payer: Self-pay | Admitting: Oncology

## 2021-01-11 LAB — COPPER, SERUM: Copper: 99 ug/dL (ref 69–132)

## 2021-01-12 LAB — METHYLMALONIC ACID, SERUM: Methylmalonic Acid, Quantitative: 187 nmol/L (ref 0–378)

## 2021-01-27 ENCOUNTER — Telehealth: Payer: Self-pay | Admitting: *Deleted

## 2021-01-27 NOTE — Telephone Encounter (Signed)
Suanne Marker called asking about labs results and asking if testosterone level was drawn and if not would like one drawn as patient is complaining of feeling exhausted. Please advise

## 2021-01-27 NOTE — Telephone Encounter (Signed)
Call returned to [facility] by Jerene Pitch and appointment for Testosterone level to be drawn given, then she asked to speak with nurse re results so I called her and went over lab results . She will bring him for lab tomorrow and we will see if results are the reason for his exhaustion

## 2021-01-28 ENCOUNTER — Other Ambulatory Visit: Payer: Self-pay

## 2021-01-28 ENCOUNTER — Inpatient Hospital Stay: Payer: BC Managed Care – PPO | Attending: Oncology

## 2021-01-28 DIAGNOSIS — M791 Myalgia, unspecified site: Secondary | ICD-10-CM | POA: Insufficient documentation

## 2021-01-28 DIAGNOSIS — C801 Malignant (primary) neoplasm, unspecified: Secondary | ICD-10-CM

## 2021-01-28 DIAGNOSIS — D649 Anemia, unspecified: Secondary | ICD-10-CM

## 2021-01-28 DIAGNOSIS — Z9221 Personal history of antineoplastic chemotherapy: Secondary | ICD-10-CM | POA: Insufficient documentation

## 2021-01-28 DIAGNOSIS — Z923 Personal history of irradiation: Secondary | ICD-10-CM | POA: Insufficient documentation

## 2021-01-28 DIAGNOSIS — C779 Secondary and unspecified malignant neoplasm of lymph node, unspecified: Secondary | ICD-10-CM

## 2021-01-28 DIAGNOSIS — R5383 Other fatigue: Secondary | ICD-10-CM | POA: Insufficient documentation

## 2021-01-28 NOTE — Telephone Encounter (Signed)
Thank you Hassan Rowan.  Lab orders for testosterone entered for today.

## 2021-01-29 LAB — TESTOSTERONE: Testosterone: 314 ng/dL (ref 264–916)

## 2021-01-31 ENCOUNTER — Other Ambulatory Visit: Payer: Self-pay | Admitting: Emergency Medicine

## 2021-01-31 ENCOUNTER — Telehealth: Payer: Self-pay | Admitting: *Deleted

## 2021-01-31 DIAGNOSIS — C779 Secondary and unspecified malignant neoplasm of lymph node, unspecified: Secondary | ICD-10-CM

## 2021-01-31 DIAGNOSIS — C801 Malignant (primary) neoplasm, unspecified: Secondary | ICD-10-CM

## 2021-01-31 NOTE — Telephone Encounter (Signed)
Suanne Marker called stating that the "Testosterone level came back on the lower side" and is asking if we will order medicine for patient for this. Please advise. Please refer to note from last week where patient complained of exhaustion and she wanted [place] to check his testosterone level.  Notes Component Ref Range & Units 3 d ago   Testosterone 264 - 916 ng/dL 314

## 2021-01-31 NOTE — Telephone Encounter (Signed)
Referral to urology entered.

## 2021-01-31 NOTE — Telephone Encounter (Signed)
Dustin Hall does want the referral to Urology sent. She asked what else could be done and I advised MVI which she said he is already taking, I also recommended regular exercise and getting sunlight. She said that they will try that as well as the referral to Urology

## 2021-02-01 ENCOUNTER — Encounter: Payer: Self-pay | Admitting: Oncology

## 2021-02-03 ENCOUNTER — Other Ambulatory Visit: Payer: Self-pay

## 2021-02-03 ENCOUNTER — Encounter: Payer: Self-pay | Admitting: Oncology

## 2021-02-03 ENCOUNTER — Ambulatory Visit (INDEPENDENT_AMBULATORY_CARE_PROVIDER_SITE_OTHER): Payer: BC Managed Care – PPO | Admitting: Urology

## 2021-02-03 ENCOUNTER — Encounter: Payer: Self-pay | Admitting: Urology

## 2021-02-03 VITALS — BP 115/70 | HR 71 | Ht 72.0 in | Wt 205.0 lb

## 2021-02-03 DIAGNOSIS — R5383 Other fatigue: Secondary | ICD-10-CM | POA: Diagnosis not present

## 2021-02-03 DIAGNOSIS — R6882 Decreased libido: Secondary | ICD-10-CM | POA: Diagnosis not present

## 2021-02-03 DIAGNOSIS — N529 Male erectile dysfunction, unspecified: Secondary | ICD-10-CM | POA: Diagnosis not present

## 2021-02-03 MED ORDER — CLOMIPHENE CITRATE 50 MG PO TABS: 25.0000 mg | ORAL_TABLET | Freq: Every day | ORAL | 11 refills | Status: DC

## 2021-02-03 MED ORDER — TADALAFIL 5 MG PO TABS: 5.0000 mg | ORAL_TABLET | Freq: Every day | ORAL | 11 refills | Status: DC | PRN

## 2021-02-03 NOTE — Patient Instructions (Signed)
Erectile Dysfunction °Erectile dysfunction (ED) is the inability to get or keep an erection in order to have sexual intercourse. ED is considered a symptom of an underlying disorder and is not considered a disease. ED may include: °Inability to get an erection. °Lack of enough hardness of the erection to allow penetration. °Loss of erection before sex is finished. °What are the causes? °This condition may be caused by: °Physical causes, such as: °Artery problems. This may include heart disease, high blood pressure, atherosclerosis, and diabetes. °Hormonal problems, such as low testosterone. °Obesity. °Nerve problems. This may include back or pelvic injuries, multiple sclerosis, Parkinson's disease, spinal cord injury, and stroke. °Certain medicines, such as: °Pain relievers. °Antidepressants. °Blood pressure medicines and water pills (diuretics). °Cancer medicines. °Antihistamines. °Muscle relaxants. °Lifestyle factors, such as: °Use of drugs such as marijuana, cocaine, or opioids. °Excessive use of alcohol. °Smoking. °Lack of physical activity or exercise. °Psychological causes, such as: °Anxiety or stress. °Sadness or depression. °Exhaustion. °Fear about sexual performance. °Guilt. °What are the signs or symptoms? °Symptoms of this condition include: °Inability to get an erection. °Lack of enough hardness of the erection to allow penetration. °Loss of the erection before sex is finished. °Sometimes having normal erections, but with frequent unsatisfactory episodes. °Low sexual satisfaction in either partner due to erection problems. °A curved penis occurring with erection. The curve may cause pain, or the penis may be too curved to allow for intercourse. °Never having nighttime or morning erections. °How is this diagnosed? °This condition is often diagnosed by: °Performing a physical exam to find other diseases or specific problems with the penis. °Asking you detailed questions about the problem. °Doing tests,  such as: °Blood tests to check for diabetes mellitus or high cholesterol, or to measure hormone levels. °Other tests to check for underlying health conditions. °An ultrasound exam to check for scarring. °A test to check blood flow to the penis. °Doing a sleep study at home to measure nighttime erections. °How is this treated? °This condition may be treated by: °Medicines, such as: °Medicine taken by mouth to help you achieve an erection (oral medicine). °Hormone replacement therapy to replace low testosterone levels. °Medicine that is injected into the penis. Your health care provider may instruct you how to give yourself these injections at home. °Medicine that is delivered with a short applicator tube. The tube is inserted into the opening at the tip of the penis, which is the opening of the urethra. A tiny pellet of medicine is put in the urethra. The pellet dissolves and enhances erectile function. This is also called MUSE (medicated urethral system for erections) therapy. °Vacuum pump. This is a pump with a ring on it. The pump and ring are placed on the penis and used to create pressure that helps the penis become erect. °Penile implant surgery. In this procedure, you may receive: °An inflatable implant. This consists of cylinders, a pump, and a reservoir. The cylinders can be inflated with a fluid that helps to create an erection, and they can be deflated after intercourse. °A semi-rigid implant. This consists of two silicone rubber rods. The rods provide some rigidity. They are also flexible, so the penis can both curve downward in its normal position and become straight for sexual intercourse. °Blood vessel surgery to improve blood flow to the penis. During this procedure, a blood vessel from a different part of the body is placed into the penis to allow blood to flow around (bypass) damaged or blocked blood vessels. °Lifestyle changes,   such as exercising more, losing weight, and quitting smoking. °Follow  these instructions at home: °Medicines ° °Take over-the-counter and prescription medicines only as told by your health care provider. Do not increase the dosage without first discussing it with your health care provider. °If you are using self-injections, do injections as directed by your health care provider. Make sure you avoid any veins that are on the surface of the penis. After giving an injection, apply pressure to the injection site for 5 minutes. °Talk to your health care provider about how to prevent headaches while taking ED medicines. These medicines may cause a sudden headache due to the increase in blood flow in your body. °General instructions °Exercise regularly, as directed by your health care provider. Work with your health care provider to lose weight, if needed. °Do not use any products that contain nicotine or tobacco. These products include cigarettes, chewing tobacco, and vaping devices, such as e-cigarettes. If you need help quitting, ask your health care provider. °Before using a vacuum pump, read the instructions that come with the pump and discuss any questions with your health care provider. °Keep all follow-up visits. This is important. °Contact a health care provider if: °You feel nauseous. °You are vomiting. °You get sudden headaches while taking ED medicines. °You have any concerns about your sexual health. °Get help right away if: °You are taking oral or injectable medicines and you have an erection that lasts longer than 4 hours. If your health care provider is unavailable, go to the nearest emergency room for evaluation. An erection that lasts much longer than 4 hours can result in permanent damage to your penis. °You have severe pain in your groin or abdomen. °You develop redness or severe swelling of your penis. °You have redness spreading at your groin or lower abdomen. °You are unable to urinate. °You experience chest pain or a rapid heartbeat (palpitations) after taking oral  medicines. °These symptoms may represent a serious problem that is an emergency. Do not wait to see if the symptoms will go away. Get medical help right away. Call your local emergency services (911 in the [place] Do not drive yourself to the hospital. °Summary °Erectile dysfunction (ED) is the inability to get or keep an erection during sexual intercourse. °This condition is diagnosed based on a physical exam, your symptoms, and tests to determine the cause. Treatment varies depending on the cause and may include medicines, hormone therapy, surgery, or a vacuum pump. °You may need follow-up visits to make sure that you are using your medicines or devices correctly. °Get help right away if you are taking or injecting medicines and you have an erection that lasts longer than 4 hours. °This information is not intended to replace advice given to you by your health care provider. Make sure you discuss any questions you have with your health care provider. °Document Revised: 07/07/2020 Document Reviewed: 07/07/2020 °Elsevier Patient Education © 2022 [employer] Inc[employer] ° °

## 2021-02-03 NOTE — Progress Notes (Signed)
   02/03/21 11:51 AM   Dustin Hall [birth year] [id number]  CC: Fatigue, low libido, ED  HPI: I saw Dustin Hall and his wife for the above issues today.  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  He has had no evidence of recurrence so far, and his next surveillance imaging is November.  He continues to follow with oncology and radiation oncology.  He reports at least a few months of decreased energy, low libido, fatigue, joint pain, as well as ED with difficulty maintaining an erection and achieving orgasm/ejaculation.  Testosterone was checked by his oncologist and was low normal at 314.  He previously tried Viagra in the past with good results.  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   PMH: 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 [facility]) 08/02/2018    Surgical History: Past Surgical History:  Procedure Laterality Date   APPENDECTOMY     THROAT SURGERY Right       Family History: Family History  Problem Relation Age of Onset   Aortic aneurysm Father    Prostate cancer Neg Hx    Bladder Cancer Neg Hx    Kidney cancer Neg Hx     Social History:  reports that he has never smoked. He has quit using smokeless tobacco.  His smokeless tobacco use included snuff. He reports current alcohol use. He reports that he does not use drugs.  Physical Exam: BP 115/70   Pulse 71   Ht 6' (1.829 m)   Wt 205 lb (93 kg)   BMI 27.80 kg/m    Constitutional:  Alert and oriented, No acute distress. Cardiovascular: No clubbing, cyanosis, or edema. Respiratory: Normal respiratory effort, no increased work of  breathing. GI: Abdomen is soft, nontender, nondistended, no abdominal masses   Laboratory Data: Reviewed, see HPI  Assessment & Plan:   60 year old male with history of squamous cell carcinoma of the head neck treated with chemotherapy and radiation 2 years ago with no evidence of recurrence, and fatigue, low energy, low libido, ED, and difficulty reaching orgasm.  We discussed his risk factors including age, working overnight, and history of chemotherapy/radiation.  With his low normal testosterone, I recommended a trial of Clomid 25 mg daily, as well as Cialis 5 mg daily, with close follow-up in 1 month for repeat testosterone and symptom check regarding erections.  Risks and benefits of these medications discussed extensively.  We also reviewed behavioral strategies at length including healthy eating, adequate sleep, exercise.  Dustin Madrid, MD 02/03/2021  [facility] [address], [place], [place] [address] [phone]

## 2021-02-28 ENCOUNTER — Other Ambulatory Visit: Payer: Self-pay

## 2021-02-28 ENCOUNTER — Other Ambulatory Visit: Payer: BC Managed Care – PPO

## 2021-02-28 DIAGNOSIS — R5383 Other fatigue: Secondary | ICD-10-CM

## 2021-03-01 LAB — TESTOSTERONE: Testosterone: 729 ng/dL (ref 264–916)

## 2021-03-02 ENCOUNTER — Ambulatory Visit: Payer: BC Managed Care – PPO | Admitting: Urology

## 2021-03-02 ENCOUNTER — Encounter: Payer: Self-pay | Admitting: Urology

## 2021-03-03 ENCOUNTER — Ambulatory Visit: Payer: BC Managed Care – PPO | Admitting: Urology

## 2021-03-04 ENCOUNTER — Other Ambulatory Visit: Payer: BC Managed Care – PPO

## 2021-03-04 ENCOUNTER — Ambulatory Visit
Admission: RE | Admit: 2021-03-04 | Discharge: 2021-03-04 | Disposition: A | Discharge status: Home / Self Care | Payer: BC Managed Care – PPO | Source: Ambulatory Visit | Attending: Radiation Oncology | Admitting: Radiation Oncology

## 2021-03-04 ENCOUNTER — Other Ambulatory Visit: Payer: Self-pay

## 2021-03-04 DIAGNOSIS — C760 Malignant neoplasm of head, face and neck: Secondary | ICD-10-CM | POA: Diagnosis present

## 2021-03-04 MED ORDER — IOHEXOL 300 MG/ML  SOLN
75.0000 mL | Freq: Once | INTRAMUSCULAR | Status: AC | PRN
  Administered 2021-03-04: 75 mL via INTRAVENOUS

## 2021-03-11 ENCOUNTER — Ambulatory Visit: Payer: BC Managed Care – PPO | Admitting: Radiation Oncology

## 2021-04-06 ENCOUNTER — Other Ambulatory Visit: Payer: Self-pay

## 2021-04-06 ENCOUNTER — Ambulatory Visit (INDEPENDENT_AMBULATORY_CARE_PROVIDER_SITE_OTHER): Payer: BC Managed Care – PPO | Admitting: Urology

## 2021-04-06 ENCOUNTER — Encounter: Payer: Self-pay | Admitting: Urology

## 2021-04-06 VITALS — BP 147/84 | HR 71 | Ht 72.0 in | Wt 212.0 lb

## 2021-04-06 DIAGNOSIS — R6882 Decreased libido: Secondary | ICD-10-CM | POA: Diagnosis not present

## 2021-04-06 DIAGNOSIS — Z125 Encounter for screening for malignant neoplasm of prostate: Secondary | ICD-10-CM | POA: Diagnosis not present

## 2021-04-06 DIAGNOSIS — N529 Male erectile dysfunction, unspecified: Secondary | ICD-10-CM | POA: Diagnosis not present

## 2021-04-06 MED ORDER — TADALAFIL 5 MG PO TABS: 5.0000 mg | ORAL_TABLET | Freq: Every day | ORAL | 11 refills | Status: DC | PRN

## 2021-04-06 NOTE — Progress Notes (Addendum)
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  He has had no evidence of recurrence so far, and I personally viewed and interpreted the CT from 03/04/2021 that shows no evidence of recurrence.  He continues to follow with oncology and radiation oncology.  I saw him in October for the above symptoms, and testosterone was borderline low at 314.  He opted for a trial of Clomid 25 mg daily as well as Cialis 5 mg daily.  He has done very well on this combination and reports the erections are going well.  He has significantly improved energy, but still some occasional problems with low libido.  He had an excellent response with testosterone up to 729 on clomid from 314 prior.  He would like to continue these medications.  I also recommended checking a PSA per the AUA guidelines, and will call with those results.  Continue Clomid and Cialis Call with PSA results RTC 6 months symptom check   Billey Co, MD  [facility] [address], [place], [place] [address] [phone]

## 2021-04-06 NOTE — Patient Instructions (Signed)
Prostate Cancer Screening ?Prostate cancer screening is testing that is done to check for the presence of prostate cancer in men. The prostate gland is a walnut-sized gland that is located below the bladder and in front of the rectum in males. The function of the prostate is to add fluid to semen during ejaculation. Prostate cancer is one of the most common types of cancer in men. ?Who should have prostate cancer screening? ?Screening recommendations vary based on age and other risk factors, as well as between the professional organizations who make the recommendations. ?In general, screening is recommended if: ?You are age 50 to 70 and have an average risk for prostate cancer. You should talk with your health care provider about your need for screening and how often screening should be done. Because most prostate cancers are slow growing and will not cause death, screening in this age group is generally reserved for men who have a 10- to 15-year life expectancy. ?You are younger than age 50, and you have these risk factors: ?Having a father, brother, or uncle who has been diagnosed with prostate cancer. The risk is higher if your family member's cancer occurred at an early age or if you have multiple family members with prostate cancer at an early age. ?Being a male who is Black or is of Caribbean or sub-Saharan African descent. ?In general, screening is not recommended if: ?You are younger than age 40. ?You are between the ages of 40 and 49 and you have no risk factors. ?You are 70 years of age or older. At this age, the risks that screening can cause are greater than the benefits that it may provide. ?If you are at high risk for prostate cancer, your health care provider may recommend that you have screenings more often or that you start screening at a younger age. ?How is screening for prostate cancer done? ?The recommended prostate cancer screening test is a blood test called the prostate-specific antigen (PSA)  test. PSA is a protein that is made in the prostate. As you age, your prostate naturally produces more PSA. Abnormally high PSA levels may be caused by: ?Prostate cancer. ?An enlarged prostate that is not caused by cancer (benign prostatic hyperplasia, or BPH). This condition is very common in older men. ?A prostate gland infection (prostatitis) or urinary tract infection. ?Certain medicines such as male hormones (like testosterone) or other medicines that raise testosterone levels. ?A rectal exam may be done as part of prostate cancer screening to help provide information about the size of your prostate gland. When a rectal exam is performed, it should be done after the PSA level is drawn to avoid any effect on the results. ?Depending on the PSA results, you may need more tests, such as: ?A physical exam to check the size of your prostate gland, if not done as part of screening. ?Blood and imaging tests. ?A procedure to remove tissue samples from your prostate gland for testing (biopsy). This is the only way to know for certain if you have prostate cancer. ?What are the benefits of prostate cancer screening? ?Screening can help to identify cancer at an early stage, before symptoms start and when the cancer can be treated more easily. ?There is a small chance that screening may lower your risk of dying from prostate cancer. The chance is small because prostate cancer is a slow-growing cancer, and most men with prostate cancer die from a different cause. ?What are the risks of prostate cancer screening? ?The main   risk of prostate cancer screening is diagnosing and treating prostate cancer that would never have caused any symptoms or problems. This is called overdiagnosisand overtreatment. PSA screening cannot tell you if your PSA is high due to cancer or a different cause. A prostate biopsy is the only procedure to diagnose prostate cancer. Even the results of a biopsy may not tell you if your cancer needs to be  treated. Slow-growing prostate cancer may not need any treatment other than monitoring, so diagnosing and treating it may cause unnecessary stress or other side effects. ?Questions to ask your health care provider ?When should I start prostate cancer screening? ?What is my risk for prostate cancer? ?How often do I need screening? ?What type of screening tests do I need? ?How do I get my test results? ?What do my results mean? ?Do I need treatment? ?Where to find more information ?The American Cancer Society: www.cancer.org ?American Urological Association: www.auanet.org ?Contact a health care provider if: ?You have difficulty urinating. ?You have pain when you urinate or ejaculate. ?You have blood in your urine or semen. ?You have pain in your back or in the area of your prostate. ?Summary ?Prostate cancer is a common type of cancer in men. The prostate gland is located below the bladder and in front of the rectum. This gland adds fluid to semen during ejaculation. ?Prostate cancer screening may identify cancer at an early stage, when the cancer can be treated more easily and is less likely to have spread to other areas of the body. ?The prostate-specific antigen (PSA) test is the recommended screening test for prostate cancer, but it has associated risks. ?Discuss the risks and benefits of prostate cancer screening with your health care provider. If you are age 70 or older, the risks that screening can cause are greater than the benefits that it may provide. ?This information is not intended to replace advice given to you by your health care provider. Make sure you discuss any questions you have with your health care provider. ?Document Revised: 10/04/2020 Document Reviewed: 10/04/2020 ?Elsevier Patient Education ? 2022 Elsevier Inc. ? ?

## 2021-04-07 LAB — PSA TOTAL (REFLEX TO FREE): Prostate Specific Ag, Serum: 0.5 ng/mL (ref 0.0–4.0)

## 2021-07-10 NOTE — Progress Notes (Deleted)
?[facility]  ?[phone] [phone] ? ?ID: Marella Bile OB: Nov 26, 1960  MR#: [id number]  PRF#:[id number] ? ?Patient Care Team: ?Jodelle Green, FNP as PCP - General (Family Medicine) ?Kate Sable, MD as PCP - Cardiology (Cardiology) ?Beverly Gust, MD (Otolaryngology) ?Lloyd Huger, MD as Consulting Physician (Oncology) ?Noreene Filbert, MD as Referring Physician (Radiation Oncology) ? 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 ?INTERVAL HISTORY: Patient returns to clinic today for repeat laboratory work and routine 22-monthevaluation.  He continues to have dry mouth and altered taste, but his weight has mildly improved.  He otherwise feels well.  He has no neurologic complaints. He denies any recent fevers or illnesses. He has no chest pain, shortness of breath, cough, or hemoptysis.  He denies any nausea, vomiting, constipation, or diarrhea.  He has no urinary complaints.  Patient offers no further specific complaints today. ? ?REVIEW OF SYSTEMS:   ?Review of Systems  ?Constitutional: Negative.  Negative for fever, malaise/fatigue and weight loss.  ?HENT: Negative.  Negative for sore throat.   ?     Dry mouth  ?Respiratory: Negative.  Negative for cough and hemoptysis.   ?Cardiovascular: Negative.  Negative for chest pain and leg swelling.  ?Gastrointestinal: Negative.  Negative for abdominal pain and nausea.  ?Genitourinary: Negative.  Negative for dysuria.  ?Musculoskeletal: Negative.  Negative for neck pain.  ?Skin: Negative.  Negative for rash.  ?Neurological: Negative.  Negative for dizziness, speech change, focal weakness, weakness and headaches.  ?Endo/Heme/Allergies:  Does not bruise/bleed easily.  ?Psychiatric/Behavioral: Negative.  The patient is not nervous/anxious.   ? ?As per HPI. Otherwise, a complete review of systems is negative. ? ?PAST MEDICAL HISTORY: ?Past Medical History:  ?Diagnosis Date  ?  Coronary artery disease 12/2018  ? incidental finding: coronary calcum noted in LAD and RCA on NM PET scan  ? GERD without esophagitis 03/24/2015  ? 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 ? ?PAST SURGICAL HISTORY: ?Past Surgical History:  ?Procedure Laterality Date  ? APPENDECTOMY    ? THROAT SURGERY Right   ? ? ?FAMILY HISTORY: ?Family History  ?Problem Relation Age of Onset  ? Aortic aneurysm Father   ? Prostate cancer Neg Hx   ? Bladder Cancer Neg Hx   ? Kidney cancer Neg Hx   ? ? ?ADVANCED DIRECTIVES (Y/N):  N ? ?HEALTH MAINTENANCE: ?Social History  ? ?Tobacco Use  ? Smoking status: Never  ? Smokeless tobacco: Former  ?  Types: Snuff  ? Tobacco comments:  ?  smokey mountain herbal snuff   ?Vaping Use  ? Vaping Use: Never used  ?Substance Use Topics  ? Alcohol use: Yes  ?  Alcohol/week: 0.0 standard drinks  ?  Comment: occasional  ? Drug use: No  ? ? ? Colonoscopy: ? PAP: ? Bone density: ? Lipid panel: ? ?No Known Allergies ? ?Current Outpatient Medications  ?Medication Sig Dispense Refill  ? aspirin EC 81 MG tablet Take 1 tablet (81 mg total) by mouth daily. 90 tablet 3  ? clomiPHENE (CLOMID) 50 MG tablet Take 0.5 tablets (25 mg total) by mouth daily. 30 tablet 11  ? Multiple Vitamin (MULTI-VITAMIN) tablet Take 1 tablet by mouth 1 day or 1 dose.    ? omega-3 fish oil (MAXEPA) 1000 MG CAPS capsule Take 1 capsule by mouth daily.    ? SODIUM FLUORIDE 5000 ENAMEL 1.1-5 % GEL     ?  tadalafil (CIALIS) 5 MG tablet Take 1-2 tablets (5-10 mg total) by mouth daily as needed for erectile dysfunction. 30 tablet 11  ? UNABLE TO FIND Med Name: Man T OTC vitamin for Low T    ? ?No current facility-administered medications for this visit.  ? ? ?OBJECTIVE: ?There were no vitals filed for this visit. ?   There is no height or weight on file to calculate BMI.    ECOG FS:0 - Asymptomatic ? ?General: Well-developed, well-nourished, no acute distress. ?Eyes: Pink  conjunctiva, anicteric sclera. ?HEENT: Normocephalic, moist mucous membranes.  No palpable lymphadenopathy. ?Lungs: No audible wheezing or coughing. ?Heart: Regular rate and rhythm. ?Abdomen: Soft, nontender, no obvious distention. ?Musculoskeletal: No edema, cyanosis, or clubbing. ?Neuro: Alert, answering all questions appropriately. Cranial nerves grossly intact. ?Skin: No rashes or petechiae noted. ?Psych: Normal affect. ? ?LAB RESULTS: ? ?Lab Results  ?Component Value Date  ? NA 138 01/10/2021  ? K 4.1 01/10/2021  ? CL 101 01/10/2021  ? CO2 29 01/10/2021  ? GLUCOSE 124 (H) 01/10/2021  ? BUN 17 01/10/2021  ? CREATININE 0.83 01/10/2021  ? CALCIUM 9.3 01/10/2021  ? PROT 7.5 01/10/2021  ? ALBUMIN 4.5 01/10/2021  ? AST 30 01/10/2021  ? ALT 27 01/10/2021  ? ALKPHOS 78 01/10/2021  ? BILITOT 0.9 01/10/2021  ? GFRNONAA >60 01/10/2021  ? GFRAA >60 01/08/2020  ? ? ?Lab Results  ?Component Value Date  ? WBC 4.7 01/10/2021  ? NEUTROABS 3.5 01/10/2021  ? HGB 12.5 (L) 01/10/2021  ? HCT 37.6 (L) 01/10/2021  ? MCV 89.3 01/10/2021  ? PLT 170 01/10/2021  ? ? ? ?STUDIES: ?No results found. ? 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 ?PLAN:   ? ?1. 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  Given patient's persistent pancytopenia, weight loss, and dysphasia, cisplatin was discontinued early and he received his fifth and final infusion on Sep 20, 2018.  PET scan results from January 02, 2019 reviewed independently with no obvious evidence of persistent or recurrent disease.  No further imaging is necessary unless there is concern of recurrence.  No further intervention is needed.  Continue follow-up with ENT as scheduled.  Return to clinic in 6 months for repeat laboratory and further evaluation. ?2.  Dysphasia: Resolved. ?3.  Weight loss: Patient's weight has remained stable and he has gained 2 pounds in the interim. ?4.   Anxiety: Continue Xanax as needed. ?5.  Pancytopenia: Essentially resolved.  Patient continues to have a mild thrombocytopenia that is chronic and unchanged. ?6.  Dry mouth/altered taste: Chronic and unchanged.  Secondary to XRT.  Continue symptomatic treatment as directed. ? ?Patient expressed understanding and was in agreement with this plan. He also understands that He can call clinic at any time with any questions, concerns, or complaints.  ? ? 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 ? ?Lloyd Huger, MD   07/10/2021 8:48 AM ? ? ? ? ?

## 2021-07-12 ENCOUNTER — Inpatient Hospital Stay: Payer: BC Managed Care – PPO | Admitting: Oncology

## 2021-07-12 ENCOUNTER — Inpatient Hospital Stay: Payer: BC Managed Care – PPO | Attending: Oncology

## 2021-07-12 DIAGNOSIS — C779 Secondary and unspecified malignant neoplasm of lymph node, unspecified: Secondary | ICD-10-CM

## 2021-10-05 ENCOUNTER — Ambulatory Visit: Payer: BC Managed Care – PPO | Admitting: Urology

## 2021-10-05 ENCOUNTER — Ambulatory Visit (INDEPENDENT_AMBULATORY_CARE_PROVIDER_SITE_OTHER): Payer: BC Managed Care – PPO | Admitting: Urology

## 2021-10-05 ENCOUNTER — Encounter: Payer: Self-pay | Admitting: Urology

## 2021-10-05 ENCOUNTER — Other Ambulatory Visit
Admission: RE | Admit: 2021-10-05 | Discharge: 2021-10-05 | Disposition: A | Discharge status: Home / Self Care | Payer: BC Managed Care – PPO | Attending: Urology | Admitting: Urology

## 2021-10-05 VITALS — BP 158/85 | HR 72 | Ht 72.0 in | Wt 212.0 lb

## 2021-10-05 DIAGNOSIS — N529 Male erectile dysfunction, unspecified: Secondary | ICD-10-CM

## 2021-10-05 DIAGNOSIS — R6882 Decreased libido: Secondary | ICD-10-CM

## 2021-10-05 DIAGNOSIS — E291 Testicular hypofunction: Secondary | ICD-10-CM

## 2021-10-05 LAB — COMPREHENSIVE METABOLIC PANEL
ALT: 23 U/L (ref 0–44)
AST: 30 U/L (ref 15–41)
Albumin: 4.6 g/dL (ref 3.5–5.0)
Alkaline Phosphatase: 53 U/L (ref 38–126)
Anion gap: 7 (ref 5–15)
BUN: 18 mg/dL (ref 6–20)
CO2: 27 mmol/L (ref 22–32)
Calcium: 9.2 mg/dL (ref 8.9–10.3)
Chloride: 102 mmol/L (ref 98–111)
Creatinine, Ser: 0.96 mg/dL (ref 0.61–1.24)
GFR, Estimated: >60 mL/min (ref >60)
Glucose, Bld: 111 mg/dL — ABNORMAL HIGH (ref 70–99)
Potassium: 4.4 mmol/L (ref 3.5–5.1)
Sodium: 136 mmol/L (ref 135–145)
Total Bilirubin: 0.6 mg/dL (ref 0.3–1.2)
Total Protein: 7.9 g/dL (ref 6.5–8.1)

## 2021-10-05 NOTE — Progress Notes (Signed)
   10/05/2021 9:27 AM   Dustin Hall Jan 17, 1961 [id number]  Reason for visit: Follow up hypogonadism, fatigue, low libido, ED, PSA screening  HPI: He is a 61 year old male with a history of metastatic squamous cell carcinoma of the head and neck who previously underwent radiation and chemotherapy in 2020.  He has had no evidence of recurrence so far. He continues to follow with oncology and radiation oncology.   I saw him in October 2022 for the above symptoms, and testosterone was borderline low at 314.  He opted for a trial of Clomid 25 mg daily as well as Cialis 5 mg daily.  He reports benefit from the Clomid, and had an excellent objective response and testosterone to 729 in November 2022 from 314 prior to starting the Clomid.  He does report some increase in energy and libido.  He still has a fair amount of stress in his life, and wife was recently diagnosed with cancer and is undergoing treatment.  He has not been taking the Cialis over the last month, as he has not been sexually active.  I recommended checking a CMP and testosterone today, will call with those results.  If stable can continue yearly follow-up with CMP, testosterone, PSA, and continue Clomid.  He can resume the Cialis as needed if he resumes sexual activity in the future.  Continue Clomid and Cialis Call with CMP and testosterone results today RTC 1 year with CMP, testosterone, PSA   Dustin Hall, [place] [address] [place], [place] [address] [phone]

## 2021-10-06 LAB — TESTOSTERONE: Testosterone: 780 ng/dL (ref 264–916)

## 2022-02-12 ENCOUNTER — Other Ambulatory Visit: Payer: Self-pay | Admitting: Urology

## 2022-04-08 ENCOUNTER — Other Ambulatory Visit: Payer: Self-pay | Admitting: Urology

## 2022-09-25 ENCOUNTER — Other Ambulatory Visit: Payer: Self-pay

## 2022-09-25 DIAGNOSIS — D649 Anemia, unspecified: Secondary | ICD-10-CM

## 2022-09-25 DIAGNOSIS — C779 Secondary and unspecified malignant neoplasm of lymph node, unspecified: Secondary | ICD-10-CM

## 2022-09-26 ENCOUNTER — Encounter: Payer: Self-pay | Admitting: Oncology

## 2022-09-26 ENCOUNTER — Inpatient Hospital Stay: Payer: BC Managed Care – PPO

## 2022-09-26 ENCOUNTER — Inpatient Hospital Stay: Payer: BC Managed Care – PPO | Attending: Oncology | Admitting: Oncology

## 2022-09-26 VITALS — BP 137/95 | HR 65 | Temp 98.4°F | Ht 72.0 in | Wt 209.0 lb

## 2022-09-26 DIAGNOSIS — C779 Secondary and unspecified malignant neoplasm of lymph node, unspecified: Secondary | ICD-10-CM

## 2022-09-26 DIAGNOSIS — D696 Thrombocytopenia, unspecified: Secondary | ICD-10-CM | POA: Diagnosis not present

## 2022-09-26 DIAGNOSIS — Z85818 Personal history of malignant neoplasm of other sites of lip, oral cavity, and pharynx: Secondary | ICD-10-CM | POA: Diagnosis present

## 2022-09-26 DIAGNOSIS — D649 Anemia, unspecified: Secondary | ICD-10-CM

## 2022-09-26 DIAGNOSIS — Z79899 Other long term (current) drug therapy: Secondary | ICD-10-CM | POA: Insufficient documentation

## 2022-09-26 DIAGNOSIS — K625 Hemorrhage of anus and rectum: Secondary | ICD-10-CM | POA: Diagnosis not present

## 2022-09-26 DIAGNOSIS — Z7982 Long term (current) use of aspirin: Secondary | ICD-10-CM | POA: Diagnosis not present

## 2022-09-26 DIAGNOSIS — C801 Malignant (primary) neoplasm, unspecified: Secondary | ICD-10-CM | POA: Diagnosis not present

## 2022-09-26 DIAGNOSIS — D72819 Decreased white blood cell count, unspecified: Secondary | ICD-10-CM | POA: Diagnosis not present

## 2022-09-26 LAB — CBC (CANCER CENTER ONLY)
HCT: 42.7 % (ref 39.0–52.0)
Hemoglobin: 14 g/dL (ref 13.0–17.0)
MCH: 29.1 pg (ref 26.0–34.0)
MCHC: 32.8 g/dL (ref 30.0–36.0)
MCV: 88.8 fL (ref 80.0–100.0)
Platelet Count: 147 10*3/uL — ABNORMAL LOW (ref 150–400)
RBC: 4.81 MIL/uL (ref 4.22–5.81)
RDW: 13.3 % (ref 11.5–15.5)
WBC Count: 3.9 10*3/uL — ABNORMAL LOW (ref 4.0–10.5)
nRBC: 0 % (ref 0.0–0.2)

## 2022-09-26 LAB — CMP (CANCER CENTER ONLY)
ALT: 25 U/L (ref 0–44)
AST: 29 U/L (ref 15–41)
Albumin: 4.5 g/dL (ref 3.5–5.0)
Alkaline Phosphatase: 53 U/L (ref 38–126)
Anion gap: 7 (ref 5–15)
BUN: 19 mg/dL (ref 6–20)
CO2: 25 mmol/L (ref 22–32)
Calcium: 8.9 mg/dL (ref 8.9–10.3)
Chloride: 105 mmol/L (ref 98–111)
Creatinine: 0.97 mg/dL (ref 0.61–1.24)
GFR, Estimated: >60 mL/min (ref >60)
Glucose, Bld: 124 mg/dL — ABNORMAL HIGH (ref 70–99)
Potassium: 4.8 mmol/L (ref 3.5–5.1)
Sodium: 137 mmol/L (ref 135–145)
Total Bilirubin: 0.5 mg/dL (ref 0.3–1.2)
Total Protein: 7.7 g/dL (ref 6.5–8.1)

## 2022-09-26 LAB — FERRITIN: Ferritin: 44 ng/mL (ref 24–336)

## 2022-09-26 LAB — IRON AND TIBC
Iron: 76 ug/dL (ref 45–182)
Saturation Ratios: 20 % (ref 17.9–39.5)
TIBC: 377 ug/dL (ref 250–450)
UIBC: 301 ug/dL

## 2022-09-26 NOTE — Progress Notes (Signed)
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  He returns to clinic for repeat laboratory work and routine evaluation.  He has not been seen by ENT in over a year as well.  He no longer complains of dry mouth.  He has a good appetite and his weight has remained stable.  His only complaint today is of intermittent bright red blood per rectum.  He has no neurologic complaints. He denies any recent fevers or illnesses. He has no chest pain, shortness of breath, cough, or hemoptysis.  He denies any nausea, vomiting, constipation, or diarrhea.  He has no urinary complaints.  Patient offers no further specific complaints today.  REVIEW OF SYSTEMS:   Review of Systems  Constitutional: Negative.  Negative for fever, malaise/fatigue and weight loss.  HENT: Negative.  Negative for sore throat.   Respiratory: Negative.  Negative for cough and hemoptysis.   Cardiovascular: Negative.  Negative for chest pain and leg swelling.  Gastrointestinal:  Positive for blood in stool. Negative for abdominal pain and nausea.  Genitourinary: Negative.  Negative for dysuria.  Musculoskeletal: Negative.  Negative for neck pain.  Skin: Negative.  Negative for rash.  Neurological: Negative.  Negative for dizziness, speech change, focal weakness, weakness and headaches.  Endo/Heme/Allergies:  Does not bruise/bleed easily.  Psychiatric/Behavioral: Negative.  The patient is not nervous/anxious.      As per HPI. Otherwise, a complete review of systems is negative.  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 [facility]) 08/02/2018    PAST SURGICAL HISTORY: Past Surgical History:  Procedure Laterality Date   APPENDECTOMY     THROAT SURGERY Right     FAMILY HISTORY: Family History  Problem Relation Age of Onset   Aortic aneurysm Father    Prostate cancer Neg Hx    Bladder Cancer Neg Hx    Kidney cancer Neg Hx     ADVANCED DIRECTIVES (Y/N):  N  HEALTH MAINTENANCE: Social History   Tobacco Use   Smoking status: Never   Smokeless tobacco: Former    Types: Snuff   Tobacco comments:    smokey mountain herbal snuff   Vaping Use   Vaping Use: Never used  Substance Use Topics   Alcohol use: Yes    Alcohol/week: 0.0 standard drinks of alcohol    Comment: occasional   Drug use: No     Colonoscopy:  PAP:  Bone density:  Lipid panel:  No Known Allergies  Current Outpatient Medications  Medication Sig Dispense Refill   aspirin EC 81 MG tablet Take 1 tablet (81 mg total) by mouth daily. 90 tablet 3   clomiPHENE (CLOMID) 50 MG tablet TAKE 1/2 TABLET BY MOUTH EVERY DAY 15 tablet 6   Multiple Vitamin (MULTI-VITAMIN) tablet Take 1 tablet by mouth 1 day or 1 dose.  omega-3 fish oil (MAXEPA) 1000 MG CAPS capsule Take 1 capsule by mouth daily.     SODIUM FLUORIDE 5000 ENAMEL 1.1-5 % GEL      tadalafil (CIALIS) 5 MG tablet TAKE 1 TABLET BY MOUTH ONCE DAILY AS NEEDED FOR ERECTILE DYSFUNCTION 30 tablet 9   No current facility-administered medications for this visit.    OBJECTIVE: Vitals:   09/26/22 0944  BP: (!) 137/95  Pulse: 65  Temp: 98.4 F (36.9 C)  SpO2: 99%     Body mass index is 28.35 kg/m.    ECOG FS:0 -  Asymptomatic  General: Well-developed, well-nourished, no acute distress. Eyes: Pink conjunctiva, anicteric sclera. HEENT: Normocephalic, moist mucous membranes. Lungs: No audible wheezing or coughing. Heart: Regular rate and rhythm. Abdomen: Soft, nontender, no obvious distention. Musculoskeletal: No edema, cyanosis, or clubbing. Neuro: Alert, answering all questions appropriately. Cranial nerves grossly intact. Skin: No rashes or petechiae noted. Psych: Normal affect.  LAB RESULTS:  Lab Results  Component Value Date   NA 137 09/26/2022   K 4.8 09/26/2022   CL 105 09/26/2022   CO2 25 09/26/2022   GLUCOSE 124 (H) 09/26/2022   BUN 19 09/26/2022   CREATININE 0.97 09/26/2022   CALCIUM 8.9 09/26/2022   PROT 7.7 09/26/2022   ALBUMIN 4.5 09/26/2022   AST 29 09/26/2022   ALT 25 09/26/2022   ALKPHOS 53 09/26/2022   BILITOT 0.5 09/26/2022   GFRNONAA >60 09/26/2022   GFRAA >60 01/08/2020    Lab Results  Component Value Date   WBC 3.9 (L) 09/26/2022   NEUTROABS 3.5 01/10/2021   HGB 14.0 09/26/2022   HCT 42.7 09/26/2022   MCV 88.8 09/26/2022   PLT 147 (L) 09/26/2022     STUDIES: No results found.  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  Given patient's persistent pancytopenia, weight loss, and dysphasia, cisplatin was discontinued early and he received his fifth and final infusion on Sep 20, 2018.  PET scan results from January 02, 2019 reviewed independently with no obvious evidence of persistent or recurrent disease.  No further imaging is necessary unless there is concern of recurrence.  No further intervention is needed.  Patient reports he has not been seen by ENT in over a year so referral was sent back for evaluation.  Return to clinic in 1 year for further evaluation at which point patient can likely be discharged  from clinic.   Bright red blood per rectum: Patient was given a referral to GI for consideration of colonoscopy.   Dry mouth/dysphagia: Resolved.   Leukopenia: Mild. Thrombocytopenia: Mild.   Patient expressed understanding and was in agreement with this plan. He also understands that He can call clinic at any time with any questions, concerns, or complaints.    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

## 2022-09-26 NOTE — Progress Notes (Signed)
Having some rectal bleeding that has been on and off since stopping chemo.

## 2022-10-10 ENCOUNTER — Other Ambulatory Visit
Admission: RE | Admit: 2022-10-10 | Discharge: 2022-10-10 | Disposition: A | Discharge status: Home / Self Care | Payer: BC Managed Care – PPO | Attending: Urology | Admitting: Urology

## 2022-10-10 ENCOUNTER — Encounter: Payer: Self-pay | Admitting: Urology

## 2022-10-10 ENCOUNTER — Ambulatory Visit (INDEPENDENT_AMBULATORY_CARE_PROVIDER_SITE_OTHER): Payer: BC Managed Care – PPO | Admitting: Urology

## 2022-10-10 VITALS — BP 149/92 | HR 66 | Ht 72.0 in | Wt 209.0 lb

## 2022-10-10 DIAGNOSIS — Z125 Encounter for screening for malignant neoplasm of prostate: Secondary | ICD-10-CM | POA: Diagnosis not present

## 2022-10-10 DIAGNOSIS — R6882 Decreased libido: Secondary | ICD-10-CM | POA: Diagnosis present

## 2022-10-10 DIAGNOSIS — N529 Male erectile dysfunction, unspecified: Secondary | ICD-10-CM

## 2022-10-10 LAB — PSA: Prostatic Specific Antigen: 0.49 ng/mL (ref 0.00–4.00)

## 2022-10-10 MED ORDER — CLOMID 50 MG PO TABS: 25.0000 mg | ORAL_TABLET | Freq: Every day | ORAL | 6 refills | Status: DC

## 2022-10-10 MED ORDER — TADALAFIL 5 MG PO TABS: 5.0000 mg | ORAL_TABLET | Freq: Every day | ORAL | 11 refills | Status: DC | PRN

## 2022-10-10 NOTE — Progress Notes (Signed)
   10/10/2022 8:52 AM   Teena Irani Hawker 23-Aug-1960 [id number]  Reason for visit: Follow up hypogonadism, fatigue, low libido, ED, PSA screening  HPI: He is a 62 year old male with a history of metastatic squamous cell carcinoma of the head and neck who previously underwent radiation and chemotherapy in 2020.  He has had no evidence of recurrence so far. He continues to follow with oncology and radiation oncology.   He has a history of a borderline low testosterone of 314, and ultimately opted for trial of Clomid 25 mg daily.  He had an excellent response with increase in testosterone to the 700 range, with improvement in energy and libido.  He denies any changes over the last year, testosterone and PSA are pending.  CMP CBC reviewed from recent labs from oncology and normal.  He is using Cialis 5 mg as needed for ED with moderate results.  We discussed this dose could be increased to 20 mg on demand, and risk and benefits were discussed.  Follow-up testosterone and PSA today, call with results Clomid refilled Cialis dose increased to 5 to 20 mg on demand Continue yearly follow-up for testosterone, PSA, CMP monitoring on Clomid   Sondra Come, MD  [facility] [address] [place], [place] [address] [phone]

## 2022-10-11 ENCOUNTER — Ambulatory Visit: Payer: BC Managed Care – PPO | Admitting: Nurse Practitioner

## 2022-10-12 LAB — TESTOSTERONE: Testosterone: 609 ng/dL (ref 264–916)

## 2022-10-18 NOTE — Progress Notes (Unsigned)
Dustin Amy, PA-C [address]  [address]  [place], [place] [address]  Main: [phone]  Fax: [phone]   Gastroenterology Consultation  Referring Provider:     Tracey Hall, Dustin Hall Primary Care Physician:  Dustin Hall, Dustin Hall Primary Gastroenterologist:  Dustin Amy, PA-C / Dr. Wyline Hall  Reason for Consultation:     Rectal bleeding        HPI:   Dustin Hall is a 62 y.o. y/o male referred for consultation & management  by Dustin Hall, Dustin Hall.    He has had intermittent constipation and mild intermittent rectal bleeding after hard bowel movements for a few years.  Occasionally notices bright red blood on the tissue after hard bowel movement.  No current treatment for constipation.  Constipation has recently improved.  He denies abdominal pain.  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  He had neck surgery, chemo and radiation.  Since then he has had some difficulty swallowing large pieces of food such as steak.  He has to be very careful.  Denies vomiting or food bolus episodes.  He has lost over 70 pounds since he was treated for cancer.  He could not eat well when he was going through treatment.  Screening colonoscopy done 04/2015 at [facility] -2 ulcers in the ascending colon and cecum, 3 small polyps removed (3 mm, 5 mm, 8 mm), diverticulosis, internal hemorrhoids, inflammation in the rectum.  Biopsies showed 2 adenomas and 1 hyperplastic polyp.  Acute self-limited colitis.  EGD done 04/2015 at [facility] - Esophageal mucosa suggestive of eosinophilic esophagitis, normal stomach, erythematous duodenopathy.  Biopsies were negative for EOE, celiac, and Barrett's.   Most recent lab 09/26/2022 showed normal hemoglobin 14, platelets 147, normal ferritin and iron, normal CMP.  Mildly elevated glucose 124.  Normal PSA.   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 Authorizing Provider  aspirin EC 81 MG tablet Take 1 tablet (81 mg total) by mouth daily. 01/24/19   Dustin Hall, Dustin Hall  clomiPHENE (CLOMID) 50 MG tablet Take 0.5 tablets (25 mg total) by mouth daily. 10/10/22   Dustin Hall, Dustin Hall  Multiple Vitamin (MULTI-VITAMIN) tablet Take 1 tablet by mouth 1 day or 1 dose.    Provider, Historical, Dustin Hall  omega-3 fish oil (MAXEPA) 1000 MG CAPS capsule Take 1 capsule by mouth daily.    Provider, Historical, Dustin Hall  SODIUM FLUORIDE 5000 ENAMEL 1.1-5 % GEL  04/02/20   Provider, Historical, Dustin Hall  tadalafil (CIALIS) 5 MG tablet Take 1-4 tablets (5-20 mg total) by mouth daily as needed for erectile dysfunction. 10/10/22   Dustin Hall, Dustin Hall    Family History  Problem Relation Age of Onset   Aortic aneurysm Father    Prostate cancer Neg Hx    Bladder Cancer Neg Hx    Kidney cancer Neg Hx      Social History   Tobacco Use   Smoking status: Never    Passive exposure: Never   Smokeless  tobacco: Former    Types: Snuff   Tobacco comments:    sm[place] herbal snuff   Vaping Use   Vaping Use: Never used  Substance Use Topics   Alcohol use: Yes    Alcohol/week: 0.0 standard drinks of alcohol    Comment: occasional   Drug use: No    Allergies as of 10/19/2022   (No Known Allergies)    Review of Systems:    All systems reviewed and negative except where noted in HPI.   Physical Exam:  There were no vitals taken for this visit. No LMP for male patient. Psych:  Alert and cooperative. Normal Hall and affect. General:   Alert,  Well-developed, well-nourished, pleasant and cooperative in NAD Head:  Normocephalic  and atraumatic. Eyes:  Sclera clear, no icterus.   Conjunctiva pink. Neck:  Supple; no masses or thyromegaly. Lungs:  Respirations even and unlabored.  Clear throughout to auscultation.   No wheezes, crackles, or rhonchi. No acute distress. Heart:  Regular rate and rhythm; no murmurs, clicks, rubs, or gallops. Abdomen:  Normal bowel sounds.  No bruits.  Soft, and non-distended without masses, hepatosplenomegaly or hernias noted.  No Tenderness.  No guarding or rebound tenderness.    Neurologic:  Alert and oriented x3;  grossly normal neurologically. Psych:  Alert and cooperative. Normal Hall and affect.  Imaging Studies: No results found.  Assessment and Plan:   Dustin Hall is a 62 y.o. y/o male has been referred for:  1.  Rectal bleeding  Scheduling Colonoscopy I discussed risks of colonoscopy with patient to include risk of bleeding, colon perforation, and risk of sedation.  Patient expressed understanding and agrees to proceed with colonoscopy.   2.  Internal hemorrhoids  Discussed treatment for hemorrhoids at length. For Internal Hemorrhoids: Rx Hydrocortisone Suppositories 25mg  Insert 1 into rectum QHS x 10 days. Stressed importance of treating underlying constipation. Discussed Internal Hemorrhoid Banding if no improvement with conservative treament.   3.  Constipation Recommend High Fiber diet with fruits, vegetables, and whole grains. Drink 64 ounces of Fluids Daily. Start Miralax Mix 1 capful in a drink daily OR Colace (docusate sodium) 100mg  1 capsule once daily.   4.  Dysphagia - Mild; likely related to previous neck cancer surgery and radiation  Scheduling EGD I discussed risks of EGD with patient to include risk of bleeding, perforation, and risk of sedation.  Patient expressed understanding and agrees to proceed with EGD.   5.  History of adenomatous colon polyps  Follow up in 4 weeks after procedures.  Dustin Amy, PA-C

## 2022-10-19 ENCOUNTER — Encounter: Payer: Self-pay | Admitting: Physician Assistant

## 2022-10-19 ENCOUNTER — Other Ambulatory Visit: Payer: Self-pay

## 2022-10-19 ENCOUNTER — Ambulatory Visit (INDEPENDENT_AMBULATORY_CARE_PROVIDER_SITE_OTHER): Payer: BC Managed Care – PPO | Admitting: Physician Assistant

## 2022-10-19 VITALS — BP 150/88 | HR 65 | Temp 98.1°F | Ht 72.0 in | Wt 207.6 lb

## 2022-10-19 DIAGNOSIS — R1310 Dysphagia, unspecified: Secondary | ICD-10-CM

## 2022-10-19 DIAGNOSIS — K625 Hemorrhage of anus and rectum: Secondary | ICD-10-CM

## 2022-10-19 DIAGNOSIS — K649 Unspecified hemorrhoids: Secondary | ICD-10-CM | POA: Diagnosis not present

## 2022-10-19 DIAGNOSIS — Z86010 Personal history of colonic polyps: Secondary | ICD-10-CM

## 2022-10-19 MED ORDER — HYDROCORTISONE ACETATE 25 MG RE SUPP
25.0000 mg | Freq: Two times a day (BID) | RECTAL | 1 refills | Status: DC
Start: 2022-10-19 — End: 2023-09-26

## 2022-10-19 MED ORDER — PEG 3350-KCL-NA BICARB-NACL 420 G PO SOLR: ORAL | 0 refills | Status: DC

## 2022-11-08 ENCOUNTER — Encounter: Payer: Self-pay | Admitting: Gastroenterology

## 2022-11-08 ENCOUNTER — Ambulatory Visit
Admission: RE | Admit: 2022-11-08 | Discharge: 2022-11-08 | Disposition: A | Discharge status: Home / Self Care | Payer: BC Managed Care – PPO | Source: Ambulatory Visit | Attending: Gastroenterology | Admitting: Gastroenterology

## 2022-11-08 ENCOUNTER — Ambulatory Visit: Payer: BC Managed Care – PPO | Admitting: Registered Nurse

## 2022-11-08 ENCOUNTER — Encounter
Admission: RE | Disposition: A | Discharge status: Home / Self Care | Payer: Self-pay | Source: Ambulatory Visit | Attending: Gastroenterology

## 2022-11-08 DIAGNOSIS — K640 First degree hemorrhoids: Secondary | ICD-10-CM | POA: Insufficient documentation

## 2022-11-08 DIAGNOSIS — D126 Benign neoplasm of colon, unspecified: Secondary | ICD-10-CM | POA: Diagnosis not present

## 2022-11-08 DIAGNOSIS — I2510 Atherosclerotic heart disease of native coronary artery without angina pectoris: Secondary | ICD-10-CM | POA: Insufficient documentation

## 2022-11-08 DIAGNOSIS — K625 Hemorrhage of anus and rectum: Secondary | ICD-10-CM | POA: Diagnosis not present

## 2022-11-08 DIAGNOSIS — Z87891 Personal history of nicotine dependence: Secondary | ICD-10-CM | POA: Insufficient documentation

## 2022-11-08 DIAGNOSIS — K219 Gastro-esophageal reflux disease without esophagitis: Secondary | ICD-10-CM | POA: Insufficient documentation

## 2022-11-08 DIAGNOSIS — K2289 Other specified disease of esophagus: Secondary | ICD-10-CM | POA: Diagnosis not present

## 2022-11-08 DIAGNOSIS — D124 Benign neoplasm of descending colon: Secondary | ICD-10-CM | POA: Diagnosis not present

## 2022-11-08 DIAGNOSIS — K222 Esophageal obstruction: Secondary | ICD-10-CM | POA: Diagnosis not present

## 2022-11-08 DIAGNOSIS — D122 Benign neoplasm of ascending colon: Secondary | ICD-10-CM | POA: Diagnosis not present

## 2022-11-08 DIAGNOSIS — R1310 Dysphagia, unspecified: Secondary | ICD-10-CM | POA: Diagnosis not present

## 2022-11-08 DIAGNOSIS — Z86010 Personal history of colonic polyps: Secondary | ICD-10-CM

## 2022-11-08 HISTORY — PX: COLONOSCOPY WITH PROPOFOL: SHX5780

## 2022-11-08 HISTORY — PX: BIOPSY: SHX5522

## 2022-11-08 HISTORY — PX: POLYPECTOMY: SHX5525

## 2022-11-08 HISTORY — PX: ESOPHAGOGASTRODUODENOSCOPY (EGD) WITH PROPOFOL: SHX5813

## 2022-11-08 SURGERY — ESOPHAGOGASTRODUODENOSCOPY (EGD) WITH PROPOFOL
Anesthesia: General

## 2022-11-08 MED ORDER — PROPOFOL 500 MG/50ML IV EMUL
INTRAVENOUS | Status: DC | PRN
  Administered 2022-11-08: 100 ug/kg/min via INTRAVENOUS

## 2022-11-08 MED ORDER — LIDOCAINE HCL (CARDIAC) PF 100 MG/5ML IV SOSY
PREFILLED_SYRINGE | INTRAVENOUS | Status: DC | PRN
  Administered 2022-11-08: 50 mg via INTRAVENOUS

## 2022-11-08 MED ORDER — LIDOCAINE HCL (PF) 2 % IJ SOLN
INTRAMUSCULAR | Status: AC
  Filled 2022-11-08: qty 5

## 2022-11-08 MED ORDER — PROPOFOL 10 MG/ML IV BOLUS
INTRAVENOUS | Status: AC
  Filled 2022-11-08: qty 20

## 2022-11-08 MED ORDER — SODIUM CHLORIDE 0.9 % IV SOLN: INTRAVENOUS | Status: DC

## 2022-11-08 MED ORDER — PROPOFOL 10 MG/ML IV BOLUS
INTRAVENOUS | Status: DC | PRN
Start: 2022-11-08 — End: 2022-11-08
  Administered 2022-11-08: 100 mg via INTRAVENOUS
  Administered 2022-11-08: 30 mg via INTRAVENOUS
  Administered 2022-11-08: 20 mg via INTRAVENOUS
  Administered 2022-11-08: 30 mg via INTRAVENOUS
  Administered 2022-11-08: 20 mg via INTRAVENOUS

## 2022-11-08 NOTE — Anesthesia Preprocedure Evaluation (Signed)
Anesthesia Evaluation  Patient identified by MRN, date of birth, ID band Patient awake    Reviewed: Allergy & Precautions, NPO status , Patient's Chart, lab work & pertinent test results  History of Anesthesia Complications Negative for: history of anesthetic complications  Airway Mallampati: II  TM Distance: >3 FB Neck ROM: Full    Dental no notable dental hx. (+) Teeth Intact   Pulmonary neg pulmonary ROS, neg sleep apnea, neg COPD, Patient abstained from smoking.Not current smoker   Pulmonary exam normal breath sounds clear to auscultation       Cardiovascular Exercise Tolerance: Good METS(-) hypertension+ CAD  (-) Past MI (-) dysrhythmias  Rhythm:Regular Rate:Normal - Systolic murmurs    Neuro/Psych negative neurological ROS  negative psych ROS   GI/Hepatic ,GERD  Medicated,,(+)     (-) substance abuse    Endo/Other  neg diabetes    Renal/GU negative Renal ROS     Musculoskeletal   Abdominal   Peds  Hematology   Anesthesia Other Findings Past Medical History: 12/2018: Coronary artery disease     Comment:  incidental finding: coronary calcum noted in LAD and RCA              on NM PET scan 03/24/2015: GERD without esophagitis 08/02/2018: Metastatic squamous cell carcinoma involving lymph node  with unknown primary site [facility])  Reproductive/Obstetrics                             Anesthesia Physical Anesthesia Plan  ASA: 2  Anesthesia Plan: General   Post-op Pain Management: Minimal or no pain anticipated   Induction: Intravenous  PONV Risk Score and Plan: 2 and Propofol infusion, TIVA and Ondansetron  Airway Management Planned: Nasal Cannula  Additional Equipment: None  Intra-op Plan:   Post-operative Plan:   Informed Consent: I have reviewed the patients History and Physical, chart, labs and discussed the procedure including the risks, benefits and alternatives for  the proposed anesthesia with the patient or authorized representative who has indicated his/her understanding and acceptance.     Dental advisory given  Plan Discussed with: CRNA and Surgeon  Anesthesia Plan Comments: (Discussed risks of anesthesia with patient, including possibility of difficulty with spontaneous ventilation under anesthesia necessitating airway intervention, PONV, and rare risks such as cardiac or respiratory or neurological events, and allergic reactions. Discussed the role of CRNA in patient's perioperative care. Patient understands.)       Anesthesia Quick Evaluation

## 2022-11-08 NOTE — Op Note (Signed)
[facility] Gastroenterology Patient Name: Dustin Hall Procedure Date: 11/08/2022 9:26 AM MRN: [id number] Account #: [id number] Date of Birth: Sep 10, 1960 Admit Type: Outpatient Age: 62 Room: [facility] ENDO ROOM 3 Gender: Male Note Status: Finalized Instrument Name: Nelda Marseille [id number] Procedure:             Colonoscopy Indications:           Rectal bleeding Providers:             Wyline Mood MD, MD Referring MD:          Dione Booze MD, MD (Referring MD) Medicines:             Monitored Anesthesia Care Complications:         No immediate complications. Procedure:             Pre-Anesthesia Assessment:                        - Prior to the procedure, a History and Physical was                         performed, and patient medications, allergies and                         sensitivities were reviewed. The patient's tolerance                         of previous anesthesia was reviewed.                        - The risks and benefits of the procedure and the                         sedation options and risks were discussed with the                         patient. All questions were answered and informed                         consent was obtained.                        - ASA Grade Assessment: II - A patient with mild                         systemic disease.                        After obtaining informed consent, the colonoscope was                         passed under direct vision. Throughout the procedure,                         the patient's blood pressure, pulse, and oxygen                         saturations were monitored continuously. The                         Colonoscope was introduced through the  anus and                         advanced to the the cecum, identified by the                         appendiceal orifice. The colonoscopy was performed                         with ease. The patient tolerated the procedure well.                         The  quality of the bowel preparation was good. The                         ileocecal valve, appendiceal orifice, and rectum were                         photographed. Findings:      The perianal and digital rectal examinations were normal.      Two sessile polyps were found in the ascending colon. The polyps were 5       to 6 mm in size. These polyps were removed with a cold snare. Resection       and retrieval were complete.      A 5 mm polyp was found in the descending colon. The polyp was sessile.       The polyp was removed with a cold snare. Resection and retrieval were       complete.      Non-bleeding internal hemorrhoids were found during retroflexion. The       hemorrhoids were medium-sized and Grade I (internal hemorrhoids that do       not prolapse).      The exam was otherwise without abnormality on direct and retroflexion       views. Impression:            - Two 5 to 6 mm polyps in the ascending colon, removed                         with a cold snare. Resected and retrieved.                        - One 5 mm polyp in the descending colon, removed with                         a cold snare. Resected and retrieved.                        - Non-bleeding internal hemorrhoids.                        - The examination was otherwise normal on direct and                         retroflexion views. Recommendation:        - Discharge patient to home (with escort).                        - Resume previous diet.                        -  Continue present medications.                        - Await pathology results.                        - Repeat colonoscopy for surveillance based on                         pathology results.                        - Return to GI office as previously scheduled. Procedure Code(s):     --- Professional ---                        [phone], Colonoscopy, flexible; with removal of                         tumor(s), polyp(s), or other lesion(s) by snare                          technique Diagnosis Code(s):     --- Professional ---                        D12.2, Benign neoplasm of ascending colon                        K64.0, First degree hemorrhoids                        D12.4, Benign neoplasm of descending colon                        K62.5, Hemorrhage of anus and rectum CPT copyright 2022 American Medical Association. All rights reserved. The codes documented in this report are preliminary and upon coder review may  be revised to meet current compliance requirements. Wyline Mood, MD Wyline Mood MD, MD 11/08/2022 10:03:41 AM This report has been signed electronically. Number of Addenda: 0 Note Initiated On: 11/08/2022 9:26 AM Scope Withdrawal Time: 0 hours 9 minutes 35 seconds  Total Procedure Duration: 0 hours 13 minutes 12 seconds  Estimated Blood Loss:  Estimated blood loss: none.      [facility]

## 2022-11-08 NOTE — Anesthesia Postprocedure Evaluation (Signed)
Anesthesia Post Note  Patient: Dustin Hall  Procedure(s) Performed: ESOPHAGOGASTRODUODENOSCOPY (EGD) WITH PROPOFOL COLONOSCOPY WITH PROPOFOL Balloon dilation wire-guided BIOPSY POLYPECTOMY  Patient location during evaluation: Endoscopy Anesthesia Type: General Level of consciousness: awake and alert Pain management: pain level controlled Vital Signs Assessment: post-procedure vital signs reviewed and stable Respiratory status: spontaneous breathing, nonlabored ventilation, respiratory function stable and patient connected to nasal cannula oxygen Cardiovascular status: blood pressure returned to baseline and stable Postop Assessment: no apparent nausea or vomiting Anesthetic complications: no   No notable events documented.   Last Vitals:  Vitals:   11/08/22 1004 11/08/22 1014  BP: 113/78 120/84  Pulse: 62 61  Resp: 16 15  Temp: (!) 36.3 C   SpO2: 97% 97%    Last Pain:  Vitals:   11/08/22 1014  TempSrc:   PainSc: 0-No pain                 Corinda Gubler

## 2022-11-08 NOTE — Anesthesia Procedure Notes (Signed)
Procedure Name: General with mask airway Date/Time: 11/08/2022 9:33 AM  Performed by: Lily Lovings, CRNAPre-anesthesia Checklist: Patient identified, Emergency Drugs available, Suction available, Patient being monitored and Timeout performed Patient Re-evaluated:Patient Re-evaluated prior to induction Oxygen Delivery Method: Simple face mask Preoxygenation: Pre-oxygenation with 100% oxygen Induction Type: IV induction

## 2022-11-08 NOTE — Transfer of Care (Signed)
Immediate Anesthesia Transfer of Care Note  Patient: Dustin Hall  Procedure(s) Performed: ESOPHAGOGASTRODUODENOSCOPY (EGD) WITH PROPOFOL COLONOSCOPY WITH PROPOFOL Balloon dilation wire-guided BIOPSY POLYPECTOMY  Patient Location: PACU and Endoscopy Unit  Anesthesia Type:General  Level of Consciousness: drowsy and patient cooperative  Airway & Oxygen Therapy: Patient Spontanous Breathing and Patient connected to face mask oxygen  Post-op Assessment: Report given to RN and Patient moving all extremities X 4  Post vital signs: Reviewed and stable  Last Vitals:  Vitals Value Taken Time  BP    Temp    Pulse 59 11/08/22 1003  Resp    SpO2 97 % 11/08/22 1003  Vitals shown include unfiled device data.  Last Pain:  Vitals:   11/08/22 0922  TempSrc: Temporal  PainSc: 0-No pain         Complications: No notable events documented.

## 2022-11-08 NOTE — H&P (Signed)
Wyline Mood, MD [address], [place], [place], [address] [address], [address], [place], [place], [address] Phone: [phone]  Fax: [phone]  Primary Care Physician:  Jeralyn Ruths, MD   Pre-Procedure History & Physical: HPI:  Dustin Hall is a 62 y.o. 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 Authorizing Provider  aspirin EC 81 MG tablet Take 1 tablet (81 mg total) by mouth daily. 01/24/19   Debbe Odea, MD  clomiPHENE (CLOMID) 50 MG tablet Take 0.5 tablets (25 mg total) by mouth daily. 10/10/22   Sondra Come, MD  hydrocortisone (ANUSOL-HC) 25 MG suppository Place 1 suppository (25 mg total) rectally every 12 (twelve) hours. 10/19/22   Celso Amy, PA-C  Multiple Vitamin (MULTI-VITAMIN) tablet Take 1 tablet by mouth 1 day or 1 dose.    [provider]  omega-3 fish oil (MAXEPA) 1000 MG CAPS capsule Take 1 capsule by mouth daily.    [provider]  omeprazole (PRILOSEC) 20 MG capsule Take 20 mg by mouth daily.    [provider]  polyethylene glycol-electrolytes (NULYTELY) 420 g solution Starting at 5:00 PM: Drink one 8 oz glass of mixture every 15 minutes until you finish half of the jug. Five hours prior to procedure, drink 8 oz glass of mixture every 15 minutes until it is all gone. Make sure you do not drink anything 4 hours prior to your procedure. 10/19/22   Celso Amy, PA-C  SODIUM FLUORIDE 5000 ENAMEL 1.1-5 % GEL  04/02/20   [provider]  tadalafil (CIALIS) 5 MG tablet Take 1-4 tablets (5-20 mg total) by mouth daily as needed for erectile dysfunction. 10/10/22   Sondra Come, MD    Allergies as of 10/19/2022   (No Known Allergies)    Family History  Problem Relation Age of Onset   Aortic aneurysm Father    Prostate cancer Neg Hx    Bladder Cancer Neg Hx    Kidney cancer Neg Hx     Social History   Socioeconomic History   Marital status: Married    Spouse name: Not on file   Number of children: Not on file   Years of education: Not on file   Highest education level: Not on file  Occupational History   Not on file  Tobacco Use   Smoking status: Never    Passive exposure: Never   Smokeless tobacco: Former    Types: Snuff   Tobacco comments:    [place] herbal snuff   Vaping Use   Vaping status: Never Used  Substance and Sexual Activity   Alcohol use: Yes    Alcohol/week: 0.0 standard drinks of alcohol    Comment: occasional   Drug use: No   Sexual activity: Yes    Partners: Female  Other Topics Concern   Not on file  Social History Narrative   Not on file   Social Determinants of Health   Financial  Resource Strain: Not on file  Food Insecurity: Not on file  Transportation Needs: Not on file  Physical Activity: Not on file  Stress: Not on file  Social Connections: Not on file  Intimate Partner Violence: Not on file    Review of Systems: See HPI, otherwise negative ROS  Physical Exam: Ht 6' (1.829 m)   Wt 91.9 kg   BMI 27.48 kg/m  General:   Alert,  pleasant and cooperative in NAD Head:  Normocephalic and atraumatic. Neck:  Supple; no masses or thyromegaly. Lungs:  Clear throughout to auscultation, normal respiratory effort.    Heart:  +S1, +S2, Regular rate and rhythm, No edema. Abdomen:  Soft, nontender and nondistended. Normal bowel sounds, without guarding, and without rebound.   Neurologic:  Alert and  oriented x4;  grossly normal  neurologically.  Impression/Plan: Minus Breeding is here for an endoscopy and colonoscopy  to be performed for  evaluation of rectal bleeding +dysphagia     Risks, benefits, limitations, and alternatives regarding endoscopy have been reviewed with the patient.  Questions have been answered.  All parties agreeable.   Wyline Mood, MD  11/08/2022, 9:15 AM

## 2022-11-08 NOTE — Op Note (Signed)
[facility] Gastroenterology Patient Name: Dustin Hall Procedure Date: 11/08/2022 9:28 AM MRN: [id number] Account #: [id number] Date of Birth: 24-Jun-1960 Admit Type: Outpatient Age: 62 Room: [facility] ENDO ROOM 3 Gender: Male Note Status: Finalized Instrument Name: Upper Endoscope [id number] Procedure:             Upper GI endoscopy Indications:           Dysphagia Providers:             Wyline Mood MD, MD Referring MD:          Dione Booze MD, MD (Referring MD) Medicines:             Monitored Anesthesia Care Complications:         No immediate complications. Procedure:             Pre-Anesthesia Assessment:                        - Prior to the procedure, a History and Physical was                         performed, and patient medications, allergies and                         sensitivities were reviewed. The patient's tolerance                         of previous anesthesia was reviewed.                        - The risks and benefits of the procedure and the                         sedation options and risks were discussed with the                         patient. All questions were answered and informed                         consent was obtained.                        - ASA Grade Assessment: II - A patient with mild                         systemic disease.                        After obtaining informed consent, the endoscope was                         passed under direct vision. Throughout the procedure,                         the patient's blood pressure, pulse, and oxygen                         saturations were monitored continuously. The Endoscope                         was introduced  through the mouth, and advanced to the                         third part of duodenum. The upper GI endoscopy was                         accomplished with ease. The patient tolerated the                         procedure well. Findings:      The examined duodenum  was normal.      The stomach was normal.      The cardia and gastric fundus were normal on retroflexion.      One benign-appearing, intrinsic moderate (circumferential scarring or       stenosis; an endoscope may pass) stenosis was found at the       gastroesophageal junction. This stenosis measured 1.4 cm (inner       diameter) x less than one cm (in length). The stenosis was traversed. A       TTS dilator was passed through the scope. Dilation with a 15-16.5-18 mm       balloon dilator was performed to 16.5 mm. The dilation site was examined       and showed moderate mucosal disruption.      Mucosal changes including ringed esophagus and small-caliber esophagus       were found in the entire esophagus. Esophageal findings were graded       using the Eosinophilic Esophagitis Endoscopic Reference Score       (EoE-EREFS) as: Edema Grade 1 Present (decreased clarity or absence of       vascular markings), Rings Grade 1 Mild (subtle circumferential ridges       seen on esophageal distension), Exudates Grade 0 None (no white lesions       seen), Furrows Grade 0 None (no vertical lines seen) and Stricture       present (14 mm luminal diameter). Biopsies were taken with a cold       forceps for histology. Impression:            - Normal examined duodenum.                        - Normal stomach.                        - Benign-appearing esophageal stenosis. Dilated.                        - Esophageal mucosal changes suggestive of                         eosinophilic esophagitis. Biopsied. Recommendation:        - Await pathology results.                        - Use Prilosec (omeprazole) 40 mg PO daily for 5                         months.                        - Perform a colonoscopy today. Procedure Code(s):     ---  Professional ---                        [phone], Esophagogastroduodenoscopy, flexible,                         transoral; with transendoscopic balloon dilation of                          esophagus (less than 30 mm diameter)                        43239, 59, Esophagogastroduodenoscopy, flexible,                         transoral; with biopsy, single or multiple Diagnosis Code(s):     --- Professional ---                        K22.2, Esophageal obstruction                        K22.89, Other specified disease of esophagus                        R13.10, Dysphagia, unspecified CPT copyright 2022 American Medical Association. All rights reserved. The codes documented in this report are preliminary and upon coder review may  be revised to meet current compliance requirements. Wyline Mood, MD Wyline Mood MD, MD 11/08/2022 9:46:00 AM This report has been signed electronically. Number of Addenda: 0 Note Initiated On: 11/08/2022 9:28 AM Estimated Blood Loss:  Estimated blood loss: none.      [facility]

## 2022-11-09 ENCOUNTER — Encounter: Payer: Self-pay | Admitting: Gastroenterology

## 2022-11-13 ENCOUNTER — Encounter: Payer: Self-pay | Admitting: Gastroenterology

## 2022-11-23 ENCOUNTER — Other Ambulatory Visit: Payer: Self-pay

## 2022-11-23 ENCOUNTER — Emergency Department
Admission: EM | Admit: 2022-11-23 | Discharge: 2022-11-24 | Disposition: A | Discharge status: Home / Self Care | Payer: BC Managed Care – PPO | Attending: Emergency Medicine | Admitting: Emergency Medicine

## 2022-11-23 DIAGNOSIS — T63441A Toxic effect of venom of bees, accidental (unintentional), initial encounter: Secondary | ICD-10-CM | POA: Insufficient documentation

## 2022-11-23 MED ORDER — PREDNISONE 20 MG PO TABS: 40.0000 mg | ORAL_TABLET | Freq: Every day | ORAL | 0 refills | Status: AC

## 2022-11-23 MED ORDER — DEXAMETHASONE SODIUM PHOSPHATE 10 MG/ML IJ SOLN
10.0000 mg | Freq: Once | INTRAMUSCULAR | Status: AC
  Administered 2022-11-23: 10 mg via INTRAVENOUS
  Filled 2022-11-23: qty 1

## 2022-11-23 MED ORDER — FAMOTIDINE IN NACL 20-0.9 MG/50ML-% IV SOLN
20.0000 mg | Freq: Once | INTRAVENOUS | Status: AC
  Administered 2022-11-23: 20 mg via INTRAVENOUS
  Filled 2022-11-23: qty 50

## 2022-11-23 MED ORDER — DIPHENHYDRAMINE HCL 50 MG/ML IJ SOLN
25.0000 mg | Freq: Once | INTRAMUSCULAR | Status: AC
  Administered 2022-11-23: 25 mg via INTRAVENOUS
  Filled 2022-11-23: qty 1

## 2022-11-23 MED ORDER — FAMOTIDINE 20 MG PO TABS: 20.0000 mg | ORAL_TABLET | Freq: Two times a day (BID) | ORAL | 0 refills | Status: DC

## 2022-11-23 NOTE — Discharge Instructions (Addendum)
Your exam is consistent with local reaction to bee stings.  No signs of any anaphylaxis or angioedema.  Take the prescription meds as directed.  Take OTC Benadryl for additional antihistamine blockade. Apply ice to the face or any stings that reduce swelling and pain.   Follow-up with primary provider or return to the ED if needed.

## 2022-11-23 NOTE — ED Provider Notes (Signed)
[facility] Emergency Department Provider Note     Event Date/Time   First MD Initiated Contact with Patient 11/23/22 2210     (approximate)   History   Insect Bite and Allergic Reaction   HPI  Dustin Hall is a 62 y.o. male with a noncontributory medical history, presents to the ED for evaluation of multiple bee stings happened around 1 hour prior to arrival.  She denies any history of anaphylaxis to bee stings.  He presents after he was mowing the lawn, and sustained 4 separate stings: 2 to his right lower back, 1 to his right upper arm, and 1 to his left eyebrow.  Patient denies any difficulty breathing, swallowing, or controlling oral secretions.  He does endorse redness and swelling around his left eye and left cheek.  No other injury reported at this time.  Patient does 25 mg of Benadryl and 4 mg of Advil prior to arrival.   Physical Exam   Triage Vital Signs: ED Triage Vitals  Encounter Vitals Group     BP 11/23/22 2031 (!) 152/105     Systolic BP Percentile --      Diastolic BP Percentile --      Pulse Rate 11/23/22 2031 70     Resp 11/23/22 2031 18     Temp 11/23/22 2031 98.4 F (36.9 C)     Temp Source 11/23/22 2031 Oral     SpO2 11/23/22 2031 96 %     Weight 11/23/22 2030 200 lb (90.7 kg)     Height --      Head Circumference --      Peak Flow --      Pain Score 11/23/22 2029 4     Pain Loc --      Pain Education --      Exclude from Growth Chart --     Most recent vital signs: Vitals:   11/23/22 2235 11/23/22 2330  BP:  126/85  Pulse: (!) 54 (!) 56  Resp:  18  Temp:    SpO2: 100% 97%    General Awake, no distress.  NAD HEENT NCAT, except a single bee sting to the left upper brow with some local soft tissue swelling. PERRL. EOMI. conjunctiva are clear only.  No rhinorrhea. Mucous membranes are moist.  Uvula is midline and tonsils are flat.  No oropharyngeal lesions are appreciated.  No angioedema appreciated CV:  Good  peripheral perfusion. RRR RESP:  Normal effort. CTA ABD:  No distention.  Nontender NEURO: CN II-XII grossly intact SKIN:  No hives, whelps, noted.  Distinct erythematous papules in the area of bee sting contact   ED Results / Procedures / Treatments   Labs (all labs ordered are listed, but only abnormal results are displayed) Labs Reviewed - No data to display   EKG   RADIOLOGY  ED Provider Interpretation:   No results found.   PROCEDURES:  Critical Care performed: No  Procedures   MEDICATIONS ORDERED IN ED: Medications  dexamethasone (DECADRON) injection 10 mg (10 mg Intravenous Given 11/23/22 2225)  famotidine (PEPCID) IVPB 20 mg premix (0 mg Intravenous Stopped 11/23/22 2326)  diphenhydrAMINE (BENADRYL) injection 25 mg (25 mg Intravenous Given 11/23/22 2226)     IMPRESSION / MDM / ASSESSMENT AND PLAN / ED COURSE  I reviewed the triage vital signs and the nursing notes.  Differential diagnosis includes, but is not limited to, edema, anaphylaxis, local bee sting reaction hypertension  Patient's presentation is most consistent with acute, uncomplicated illness.  Patient's diagnosis is consistent with local bee sting reaction without evidence of angioedema or anaphylaxis.  Presents to the ED for evaluation of symptoms including soft tissue swelling to the upper brow where he sustained a bee sting.  He also endorses for the bee stings to his trunk and extremities.  Patient without history of anaphylaxis, presents after dosing Benadryl and ibuprofen at home.  No respiratory distress, angioedema, or airway compromise on presentation.  Patient with vital signs which are stable without significant tachycardia or hypotension.  She is treated empirically with IV administration of Decadron, famotidine, and Benadryl.  Fluid bolus is also provided.  Vital signs are normalized and patient endorses improvement of her symptoms at the time of this interval  evaluation.  Patient will be discharged home with prescriptions for famotidine and prednisone.  Can also take OTC Benadryl for additional antihistamine benefit patient is to follow up with his primary provider as needed or otherwise directed. Patient is given ED precautions to return to the ED for any worsening or new symptoms.     FINAL CLINICAL IMPRESSION(S) / ED DIAGNOSES   Final diagnoses:  Bee sting reaction, accidental or unintentional, initial encounter     Rx / DC Orders   ED Discharge Orders          Ordered    predniSONE (DELTASONE) 20 MG tablet  Daily with breakfast        11/23/22 2341    famotidine (PEPCID) 20 MG tablet  2 times daily        11/23/22 2341             Note:  This document was prepared using Dragon voice recognition software and may include unintentional dictation errors.    Lissa Hoard, PA-C 11/27/22 1945    Jene Every, MD 12/04/22 [phone]

## 2022-11-23 NOTE — ED Triage Notes (Addendum)
Pt presents to ER with c/o multiple bee stings that happened around 1hr ago.  Pt states he was outside mowing, when he was suddenly stung 4 separate times by bees.  Pt reports 2 bee stings to his right lower back, one bee sting to back of right upper arm, and one sting to the left eyebrow area.  Pt does have significant redness and swelling around his left eye, extending to left side of his cheeks and down his face.  Pt denies any diff breathing.  No hives noted, and all redness is mostly localized to areas that were stung.  Pt is otherwise A&O x4 and in NAD.    Pt took 25 mg benadryl and 4 advil pta.

## 2022-11-24 NOTE — ED Notes (Signed)
Patient discharged at this time. Ambulated to lobby with independent and steady gait. Breathing unlabored speaking in full sentences. Verbalized understanding of all discharge, follow up, and medication teaching. Discharged homed with all belongings.   

## 2022-12-06 ENCOUNTER — Ambulatory Visit: Payer: BC Managed Care – PPO | Admitting: Physician Assistant

## 2022-12-20 ENCOUNTER — Other Ambulatory Visit: Payer: Self-pay

## 2022-12-20 MED ORDER — OMEPRAZOLE 40 MG PO CPDR: 40.0000 mg | DELAYED_RELEASE_CAPSULE | Freq: Every day | ORAL | 1 refills | Status: AC

## 2023-01-04 ENCOUNTER — Other Ambulatory Visit: Payer: Self-pay | Admitting: Urology

## 2023-06-06 ENCOUNTER — Ambulatory Visit (INDEPENDENT_AMBULATORY_CARE_PROVIDER_SITE_OTHER): Payer: BC Managed Care – PPO

## 2023-06-06 ENCOUNTER — Ambulatory Visit
Admission: RE | Admit: 2023-06-06 | Discharge: 2023-06-06 | Disposition: A | Discharge status: Home / Self Care | Payer: BC Managed Care – PPO | Source: Ambulatory Visit | Attending: Emergency Medicine | Admitting: Emergency Medicine

## 2023-06-06 VITALS — BP 131/79 | HR 70 | Temp 99.4°F | Resp 18

## 2023-06-06 DIAGNOSIS — R058 Other specified cough: Secondary | ICD-10-CM | POA: Diagnosis not present

## 2023-06-06 DIAGNOSIS — J069 Acute upper respiratory infection, unspecified: Secondary | ICD-10-CM

## 2023-06-06 MED ORDER — AZITHROMYCIN 250 MG PO TABS: 250.0000 mg | ORAL_TABLET | Freq: Every day | ORAL | 0 refills | Status: DC

## 2023-06-06 NOTE — ED Triage Notes (Addendum)
Patient to Urgent Care with complaints of dry cough/ wheezing. Poor sleep. Worse w/ movement.  Symptoms started approx one week ago. Flu positive last Wednesday. Fevers at the beginning of illness.   Meds: multiple otc meds/ Mucinex DM/ Nyquil/ tessalon perles.

## 2023-06-06 NOTE — Discharge Instructions (Addendum)
Take the Zithromax as directed.  Follow up with your primary care provider tomorrow.  Go to the emergency department if you have worsening symptoms.

## 2023-06-06 NOTE — ED Provider Notes (Signed)
Dustin Hall    CSN: [id number] Arrival date & time: 06/06/23  1721      History   Chief Complaint Chief Complaint  Patient presents with   Cough    I had the flu last week but continue to have a dry persistent cough. Some wheezing. - Entered by patient    HPI Dustin Hall is a 63 y.o. male.  Accompanied by his wife, patient presents with 1 week history of cough, wheezing, shortness of breath with exertion.  He tested positive for influenza at home 8 days ago.  He had a fever at the beginning of his illness but none in the past few days.  No chest pain.  Multiple OTC treatments attempted without relief.  His cough is getting worse.  The history is provided by the patient, the spouse and medical records.    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 (HCC) 08/02/2018   GERD without esophagitis 03/24/2015   Family history of aneurysm 02/24/2015   Obesity, Class I, BMI 30.0-34.9 (see actual BMI) 02/24/2015   Goals of care, counseling/discussion 02/24/2015   Encounter for cholesteral screening for cardiovascular disease 02/24/2015    Past Surgical History:  Procedure Laterality Date   APPENDECTOMY     BIOPSY  11/08/2022   Procedure: BIOPSY;  Surgeon: Wyline Mood, MD;  Location: [facility] ENDOSCOPY;  Service: Gastroenterology;;   COLONOSCOPY     COLONOSCOPY WITH PROPOFOL N/A 11/08/2022   Procedure: COLONOSCOPY WITH PROPOFOL;  Surgeon: Wyline Mood, MD;  Location: [facility] ENDOSCOPY;  Service: Gastroenterology;   Laterality: N/A;   ESOPHAGOGASTRODUODENOSCOPY (EGD) WITH PROPOFOL N/A 11/08/2022   Procedure: ESOPHAGOGASTRODUODENOSCOPY (EGD) WITH PROPOFOL;  Surgeon: Wyline Mood, MD;  Location: [facility] ENDOSCOPY;  Service: Gastroenterology;  Laterality: N/A;   POLYPECTOMY  11/08/2022   Procedure: POLYPECTOMY;  Surgeon: Wyline Mood, MD;  Location: [facility] ENDOSCOPY;  Service: Gastroenterology;;   THROAT SURGERY Right        Home Medications    Prior to Admission medications   Medication Sig Start Date End Date Taking? Authorizing Provider  azithromycin (ZITHROMAX) 250 MG tablet Take 1 tablet (250 mg total) by mouth daily. Take first 2 tablets together, then 1 every day until finished. 06/06/23  Yes Mickie Bail, NP  omeprazole (PRILOSEC) 40 MG capsule Take 1 capsule (40 mg total) by mouth daily. 12/20/22   Wyline Mood, MD  aspirin EC 81 MG tablet Take 1 tablet (81 mg total) by mouth daily. 01/24/19   Debbe Odea, MD  clomiPHENE (CLOMID) 50 MG tablet Take 0.5 tablets (25 mg total) by mouth daily. 10/10/22   Sondra Come, MD  famotidine (PEPCID) 20 MG tablet Take 1 tablet (20 mg total) by mouth 2 (two) times daily for 10 days. 11/23/22 12/03/22  Menshew, Charlesetta Ivory, PA-C  hydrocortisone (ANUSOL-HC) 25 MG suppository Place 1 suppository (25 mg total) rectally every 12 (twelve) hours. 10/19/22   Celso Amy, PA-C  Multiple Vitamin (MULTI-VITAMIN) tablet Take 1 tablet by mouth 1 day or 1 dose.  [provider]  omega-3 fish oil (MAXEPA) 1000 MG CAPS capsule Take 1 capsule by mouth daily.    [provider]  polyethylene glycol-electrolytes (NULYTELY) 420 g solution Starting at 5:00 PM: Drink one 8 oz glass of mixture every 15 minutes until you finish half of the jug. Five hours prior to procedure, drink 8 oz glass of mixture every 15 minutes until it is all gone. Make sure you do not drink anything 4 hours prior to your procedure. 10/19/22   Celso Amy, PA-C  SODIUM FLUORIDE 5000 ENAMEL  1.1-5 % GEL  04/02/20   [provider]  tadalafil (CIALIS) 5 MG tablet TAKE 1 TABLET BY MOUTH ONCE DAILY AS NEEDED FOR ERECTILE DYSFUNCTION. 01/04/23   Sondra Come, MD    Family History Family History  Problem Relation Age of Onset   Aortic aneurysm Father    Prostate cancer Neg Hx    Bladder Cancer Neg Hx    Kidney cancer Neg Hx     Social History Social History   Tobacco Use   Smoking status: Never    Passive exposure: Never   Smokeless tobacco: Former    Types: Snuff   Tobacco comments:    [place] herbal snuff   Vaping Use   Vaping status: Never Used  Substance Use Topics   Alcohol use: Yes    Alcohol/week: 0.0 standard drinks of alcohol    Comment: occasional   Drug use: No     Allergies   Patient has no known allergies.   Review of Systems Review of Systems  Constitutional:  Negative for chills and fever.  HENT:  Positive for congestion. Negative for ear pain and sore throat.   Respiratory:  Positive for cough, shortness of breath and wheezing.   Cardiovascular:  Negative for chest pain and palpitations.     Physical Exam Triage Vital Signs ED Triage Vitals  Encounter Vitals Group     BP 06/06/23 1735 131/79     Systolic BP Percentile --      Diastolic BP Percentile --      Pulse Rate 06/06/23 1735 70     Resp 06/06/23 1735 18     Temp 06/06/23 1735 99.4 F (37.4 C)     Temp src --      SpO2 06/06/23 1735 95 %     Weight --      Height --      Head Circumference --      Peak Flow --      Pain Score 06/06/23 1737 3     Pain Loc --      Pain Education --      Exclude from Growth Chart --    No data found.  Updated Vital Signs BP 131/79   Pulse 70   Temp 99.4 F (37.4 C)   Resp 18   SpO2 95%   Visual Acuity Right Eye Distance:   Left Eye Distance:   Bilateral Distance:    Right Eye Near:   Left Eye Near:    Bilateral Near:     Physical Exam Constitutional:      General: He is not in acute distress. HENT:      Right Ear: Tympanic membrane normal.     Left Ear: Tympanic membrane normal.     Nose: Nose normal.     Mouth/Throat:     Mouth: Mucous membranes are moist.     Pharynx: Oropharynx is clear.  Cardiovascular:  Rate and Rhythm: Normal rate and regular rhythm.     Heart sounds: Normal heart sounds.  Pulmonary:     Effort: Pulmonary effort is normal. No respiratory distress.     Breath sounds: Rhonchi present.  Neurological:     Mental Status: He is alert.      UC Treatments / Results  Labs (all labs ordered are listed, but only abnormal results are displayed) Labs Reviewed - No data to display  EKG   Radiology DG Chest 2 View Result Date: 06/06/2023 CLINICAL DATA:  Productive cough.  Recent influenza. EXAM: CHEST - 2 VIEW COMPARISON:  None Available. FINDINGS: The cardiomediastinal contours are normal. Mild bronchial thickening. Pulmonary vasculature is normal. No consolidation, pleural effusion, or pneumothorax. No acute osseous abnormalities are seen. IMPRESSION: Bronchial thickening without focal airspace disease. Electronically Signed   By: Narda Rutherford M.D.   On: 06/06/2023 18:40    Procedures Procedures (including critical care time)  Medications Ordered in UC Medications - No data to display  Initial Impression / Assessment and Plan / UC Course  I have reviewed the triage vital signs and the nursing notes.  Pertinent labs & imaging results that were available during my care of the patient were reviewed by me and considered in my medical decision making (see chart for details).   Cough, acute upper respiratory infection.  O2 sat 95% on room air.  CXR shows bronchial thickening.  Treating with Zithromax.  Tylenol or ibuprofen as needed.  Plain Mucinex as needed.  Instructed patient to follow-up with his PCP tomorrow.  ED precautions given.  Education provided on cough and upper respiratory infection.  He agrees to plan of care.  Final Clinical Impressions(s)  / UC Diagnoses   Final diagnoses:  Productive cough  Acute upper respiratory infection     Discharge Instructions      Take the Zithromax as directed.  Follow up with your primary care provider tomorrow.  Go to the emergency department if you have worsening symptoms.        ED Prescriptions     Medication Sig Dispense Auth. Provider   azithromycin (ZITHROMAX) 250 MG tablet Take 1 tablet (250 mg total) by mouth daily. Take first 2 tablets together, then 1 every day until finished. 6 tablet Mickie Bail, NP      PDMP not reviewed this encounter.   Mickie Bail, NP 06/06/23 1859

## 2023-06-08 ENCOUNTER — Other Ambulatory Visit: Payer: Self-pay | Admitting: *Deleted

## 2023-06-08 ENCOUNTER — Encounter: Payer: Self-pay | Admitting: Oncology

## 2023-06-08 MED ORDER — HYDROCOD POLI-CHLORPHE POLI ER 10-8 MG/5ML PO SUER: 5.0000 mL | Freq: Two times a day (BID) | ORAL | 0 refills | Status: DC | PRN

## 2023-06-08 NOTE — Progress Notes (Unsigned)
tuss

## 2023-06-29 ENCOUNTER — Other Ambulatory Visit: Payer: Self-pay | Admitting: Gastroenterology

## 2023-09-25 ENCOUNTER — Other Ambulatory Visit: Payer: Self-pay | Admitting: Unknown Physician Specialty

## 2023-09-25 ENCOUNTER — Other Ambulatory Visit: Payer: Self-pay | Admitting: *Deleted

## 2023-09-25 DIAGNOSIS — R221 Localized swelling, mass and lump, neck: Secondary | ICD-10-CM

## 2023-09-25 DIAGNOSIS — K625 Hemorrhage of anus and rectum: Secondary | ICD-10-CM

## 2023-09-25 DIAGNOSIS — D355 Benign neoplasm of carotid body: Secondary | ICD-10-CM

## 2023-09-25 DIAGNOSIS — C801 Malignant (primary) neoplasm, unspecified: Secondary | ICD-10-CM

## 2023-09-26 ENCOUNTER — Encounter: Payer: Self-pay | Admitting: Oncology

## 2023-09-26 ENCOUNTER — Ambulatory Visit: Admitting: Dermatology

## 2023-09-26 ENCOUNTER — Inpatient Hospital Stay: Payer: BC Managed Care – PPO | Attending: Oncology

## 2023-09-26 ENCOUNTER — Inpatient Hospital Stay (HOSPITAL_BASED_OUTPATIENT_CLINIC_OR_DEPARTMENT_OTHER): Payer: BC Managed Care – PPO | Admitting: Oncology

## 2023-09-26 ENCOUNTER — Encounter: Payer: Self-pay | Admitting: Dermatology

## 2023-09-26 VITALS — BP 141/99 | HR 63 | Temp 97.7°F | Resp 18 | Ht 72.0 in | Wt 210.7 lb

## 2023-09-26 DIAGNOSIS — D229 Melanocytic nevi, unspecified: Secondary | ICD-10-CM

## 2023-09-26 DIAGNOSIS — C801 Malignant (primary) neoplasm, unspecified: Secondary | ICD-10-CM | POA: Diagnosis not present

## 2023-09-26 DIAGNOSIS — L237 Allergic contact dermatitis due to plants, except food: Secondary | ICD-10-CM

## 2023-09-26 DIAGNOSIS — C779 Secondary and unspecified malignant neoplasm of lymph node, unspecified: Secondary | ICD-10-CM | POA: Diagnosis not present

## 2023-09-26 DIAGNOSIS — L719 Rosacea, unspecified: Secondary | ICD-10-CM | POA: Diagnosis not present

## 2023-09-26 DIAGNOSIS — R21 Rash and other nonspecific skin eruption: Secondary | ICD-10-CM | POA: Diagnosis not present

## 2023-09-26 DIAGNOSIS — D225 Melanocytic nevi of trunk: Secondary | ICD-10-CM

## 2023-09-26 DIAGNOSIS — Z8589 Personal history of malignant neoplasm of other organs and systems: Secondary | ICD-10-CM | POA: Insufficient documentation

## 2023-09-26 LAB — CMP (CANCER CENTER ONLY)
ALT: 32 U/L (ref 0–44)
AST: 35 U/L (ref 15–41)
Albumin: 4.4 g/dL (ref 3.5–5.0)
Alkaline Phosphatase: 47 U/L (ref 38–126)
Anion gap: 10 (ref 5–15)
BUN: 20 mg/dL (ref 8–23)
CO2: 24 mmol/L (ref 22–32)
Calcium: 8.9 mg/dL (ref 8.9–10.3)
Chloride: 102 mmol/L (ref 98–111)
Creatinine: 0.95 mg/dL (ref 0.61–1.24)
GFR, Estimated: >60 mL/min (ref >60)
Glucose, Bld: 104 mg/dL — ABNORMAL HIGH (ref 70–99)
Potassium: 4.5 mmol/L (ref 3.5–5.1)
Sodium: 136 mmol/L (ref 135–145)
Total Bilirubin: 0.8 mg/dL (ref 0.0–1.2)
Total Protein: 7.5 g/dL (ref 6.5–8.1)

## 2023-09-26 LAB — CBC WITH DIFFERENTIAL/PLATELET
Abs Immature Granulocytes: 0.01 10*3/uL (ref 0.00–0.07)
Basophils Absolute: 0 10*3/uL (ref 0.0–0.1)
Basophils Relative: 1 %
Eosinophils Absolute: 0.2 10*3/uL (ref 0.0–0.5)
Eosinophils Relative: 5 %
HCT: 44.5 % (ref 39.0–52.0)
Hemoglobin: 14.4 g/dL (ref 13.0–17.0)
Immature Granulocytes: 0 %
Lymphocytes Relative: 23 %
Lymphs Abs: 1 10*3/uL (ref 0.7–4.0)
MCH: 29.1 pg (ref 26.0–34.0)
MCHC: 32.4 g/dL (ref 30.0–36.0)
MCV: 90.1 fL (ref 80.0–100.0)
Monocytes Absolute: 0.4 10*3/uL (ref 0.1–1.0)
Monocytes Relative: 8 %
Neutro Abs: 2.9 10*3/uL (ref 1.7–7.7)
Neutrophils Relative %: 63 %
Platelets: 149 10*3/uL — ABNORMAL LOW (ref 150–400)
RBC: 4.94 MIL/uL (ref 4.22–5.81)
RDW: 13.6 % (ref 11.5–15.5)
WBC: 4.5 10*3/uL (ref 4.0–10.5)
nRBC: 0 % (ref 0.0–0.2)

## 2023-09-26 MED ORDER — CLOBETASOL PROPIONATE 0.05 % EX CREA: 1.0000 | TOPICAL_CREAM | Freq: Two times a day (BID) | CUTANEOUS | 1 refills | Status: AC

## 2023-09-26 NOTE — Patient Instructions (Addendum)
 Treatment Plan: Start clobetasol 0.05% cr twice daily until smooth. Avoid applying to face, groin, and axilla. Use as directed. Long-term use can cause thinning of the skin.  Topical steroids (such as triamcinolone, fluocinolone, fluocinonide, mometasone, clobetasol, halobetasol, betamethasone, hydrocortisone ) can cause thinning and lightening of the skin if they are used for too long in the same area. Your physician has selected the right strength medicine for your problem and area affected on the body. Please use your medication only as directed by your physician to prevent side effects.   Due to recent changes in healthcare laws, you may see results of your pathology and/or laboratory studies on MyChart before the doctors have had a chance to review them. We understand that in some cases there may be results that are confusing or concerning to you. Please understand that not all results are received at the same time and often the doctors may need to interpret multiple results in order to provide you with the best plan of care or course of treatment. Therefore, we ask that you please give us  2 business days to thoroughly review all your results before contacting the office for clarification. Should we see a critical lab result, you will be contacted sooner.   If You Need Anything After Your Visit  If you have any questions or concerns for your doctor, please call our main line at [phone] and press option 4 to reach your doctor's medical assistant. If no one answers, please leave a voicemail as directed and we will return your call as soon as possible. Messages left after 4 pm will be answered the following business day.   You may also send us  a message via MyChart. We typically respond to MyChart messages within 1-2 business days.  For prescription refills, please ask your pharmacy to contact our office. Our fax number is [phone].  If you have an urgent issue when the clinic is closed  that cannot wait until the next business day, you can page your doctor at the number below.    Please note that while we do our best to be available for urgent issues outside of office hours, we are not available 24/7.   If you have an urgent issue and are unable to reach us , you may choose to seek medical care at your doctor's office, retail clinic, urgent care center, or emergency room.  If you have a medical emergency, please immediately call 911 or go to the emergency department.  Pager Numbers  - Dr. Bary Likes: [phone]  - Dr. Annette Barters: [phone]  - Dr. Felipe Horton: [phone]   In the event of inclement weather, please call our main line at [phone] for an update on the status of any delays or closures.  Dermatology Medication Tips: Please keep the boxes that topical medications come in in order to help keep track of the instructions about where and how to use these. Pharmacies typically print the medication instructions only on the boxes and not directly on the medication tubes.   If your medication is too expensive, please contact our office at [phone] option 4 or send us  a message through MyChart.   We are unable to tell what your co-pay for medications will be in advance as this is different depending on your insurance coverage. However, we may be able to find a substitute medication at lower cost or fill out paperwork to get insurance to cover a needed medication.   If a prior authorization is required to get your medication covered  by your insurance company, please allow us  1-2 business days to complete this process.  Drug prices often vary depending on where the prescription is filled and some pharmacies may offer cheaper prices.  The website www.goodrx.com contains coupons for medications through different pharmacies. The prices here do not account for what the cost may be with help from insurance (it may be cheaper with your insurance), but the website can give  you the price if you did not use any insurance.  - You can print the associated coupon and take it with your prescription to the pharmacy.  - You may also stop by our office during regular business hours and pick up a GoodRx coupon card.  - If you need your prescription sent electronically to a different pharmacy, notify our office through [facility] or by phone at [phone] option 4.     Si Usted Necesita Algo Despus de Su Visita  Tambin puede enviarnos un mensaje a travs de Clinical cytogeneticist. Por lo general respondemos a los mensajes de MyChart en el transcurso de 1 a 2 das hbiles.  Para renovar recetas, por favor pida a su farmacia que se ponga en contacto con nuestra oficina. Franz Jacks de fax es [place] [phone].  Si tiene un asunto urgente cuando la clnica est cerrada y que no puede esperar hasta el siguiente da hbil, puede llamar/localizar a su doctor(a) al nmero que aparece a continuacin.   Por favor, tenga en cuenta que aunque hacemos todo lo posible para estar disponibles para asuntos urgentes fuera del horario de [place], no estamos disponibles las 24 horas del da, los 7 [address]  [address] de la College.   Si tiene un problema urgente y no puede comunicarse con nosotros, puede optar por buscar atencin mdica  en el consultorio de su doctor(a), en una clnica privada, en un centro de atencin urgente o en una sala de emergencias.  Si tiene Engineer, drilling, por favor llame inmediatamente al 911 o vaya a la sala de emergencias.  Nmeros de bper  - Dr. Bary Likes: [phone]  - Dra. Annette Barters: [phone]  - Dr. Felipe Horton: [phone]   En caso de inclemencias del tiempo, por favor llame a Lajuan Pila principal al [phone] para una actualizacin sobre el [place] de cualquier retraso o cierre.  Consejos para la medicacin en dermatologa: Por favor, guarde las cajas en las que vienen los medicamentos de uso tpico para ayudarle a seguir las instrucciones sobre dnde  y cmo usarlos. Las farmacias generalmente imprimen las instrucciones del medicamento slo en las cajas y no directamente en los tubos del [place].   Si su medicamento es muy caro, por favor, pngase en contacto con Bettyjane Brunet llamando al [phone] y presione la opcin 4 o envenos un mensaje a travs de Clinical cytogeneticist.   No podemos decirle cul ser su copago por los medicamentos por adelantado ya que esto es diferente dependiendo de la cobertura de su seguro. Sin embargo, es posible que podamos encontrar un medicamento sustituto a Audiological scientist un formulario para que el seguro cubra el medicamento que se considera necesario.   Si se requiere una autorizacin previa para que su compaa de seguros [place] su medicamento, por favor permtanos de 1 a 2 das hbiles para completar este proceso.  Los precios de los medicamentos varan con frecuencia dependiendo del Environmental consultant de dnde se surte la receta y alguna farmacias pueden ofrecer precios ms baratos.  El sitio web www.goodrx.com tiene cupones para medicamentos de Health and safety inspector. Los precios aqu  no tienen en cuenta lo que podra costar con la ayuda del seguro (puede ser ms barato con su seguro), pero el sitio web puede darle el precio si no Visual merchandiser.  - Puede imprimir el cupn correspondiente y llevarlo con su receta a la farmacia.  - Tambin puede pasar por nuestra oficina durante el horario de atencin regular y Education officer, museum una tarjeta de cupones de GoodRx.  - Si necesita que su receta se enve electrnicamente a una farmacia diferente, informe a nuestra oficina a travs de MyChart de [place] o por telfono llamando al [phone] y presione la opcin 4.

## 2023-09-26 NOTE — Progress Notes (Signed)
   New Patient Visit   Subjective  Dustin Hall is a 63 y.o. male who presents for the following: itchy rash at left arm, sometimes burns, started off as 3 little dots and patient thought it was poison oak. Seemed to spread when he treated with cortisone. Started 3-4 days ago. Patient also has noticed some pinkness at cheeks that is new.   The patient has spots, moles and lesions to be evaluated, some may be new or changing and the patient may have concern these could be cancer.   The following portions of the chart were reviewed this encounter and updated as appropriate: medications, allergies, medical history  Review of Systems:  No other skin or systemic complaints except as noted in HPI or Assessment and Plan.  Objective  Well appearing patient in no apparent distress; mood and affect are within normal limits.   A focused examination was performed of the following areas: Face, arms, back  Relevant exam findings are noted in the Assessment and Plan.    Assessment & Plan     Rash Exam: Erythematous scaly plaque on L lateral elbow with numerous erythematous edematous papules and vesicles coalescing into a plaque on L lateral forearm  Differential diagnosis:  Contact Dermatitis to [facility]  Treatment Plan: Start clobetasol 0.05% cr twice daily until smooth. Avoid applying to face, groin, and axilla. Use as directed. Long-term use can cause thinning of the skin. Offered biopsy. Will pursue if not improving  Topical steroids (such as triamcinolone, fluocinolone, fluocinonide, mometasone, clobetasol, halobetasol, betamethasone, hydrocortisone ) can cause thinning and lightening of the skin if they are used for too long in the same area. Your physician has selected the right strength medicine for your problem and area affected on the body. Please use your medication only as directed by your physician to prevent side effects.    ROSACEA Exam Mid face erythema with  telangiectasias + scattered inflammatory papules  Chronic and persistent condition with duration or expected duration over one year. Condition is symptomatic / bothersome to patient. Not to goal.  Rosacea is a chronic progressive skin condition usually affecting the face of adults, causing redness and/or acne bumps. It is treatable but not curable. It sometimes affects the eyes (ocular rosacea) as well. It may respond to topical and/or systemic medication and can flare with stress, sun exposure, alcohol, exercise, topical steroids (including hydrocortisone /cortisone 10) and some foods.  Daily application of broad spectrum spf 30+ sunscreen to face is recommended to reduce flares.  Treatment Plan Patient not bothered by bumps, only redness Deferred today  MELANOCYTIC NEVI Exam: pink-flesh-colored symmetric papule on central lower back  Treatment Plan: Benign appearing on exam today. Recommend observation. Call clinic for new or changing moles. Recommend daily use of broad spectrum spf 30+ sunscreen to sun-exposed areas.   ALLERGIC CONTACT DERMATITIS DUE TO PLANT   ROSACEA   NEVUS    Return in about 2 weeks (around 10/10/2023) for with Dr. Felipe Horton.  Kerstin Peeling, RMA, am acting as scribe for Harris Liming, MD .   Documentation: I have reviewed the above documentation for accuracy and completeness, and I agree with the above.  Harris Liming, MD

## 2023-09-26 NOTE — Progress Notes (Signed)
 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  He continues to feel well and is asymptomatic.  A recent evaluation by ENT did not reveal any recurrence of disease.  He does not complain of dry mouth.  He denies dysphagia.  He has a good appetite and has maintained his weight.  He has no neurologic complaints. He denies any recent fevers or illnesses. He has no chest pain, shortness of breath, cough, or hemoptysis.  He denies any nausea, vomiting, constipation, or diarrhea.  He has no urinary complaints.  Patient offers no specific complaints today.  REVIEW OF SYSTEMS:   Review of Systems  Constitutional: Negative.  Negative for fever, malaise/fatigue and weight loss.  HENT: Negative.  Negative for sore throat.   Respiratory: Negative.  Negative for cough and hemoptysis.   Cardiovascular: Negative.  Negative for chest pain and leg swelling.  Gastrointestinal: Negative.  Negative for abdominal pain, blood in stool and nausea.  Genitourinary: Negative.  Negative for dysuria.  Musculoskeletal: Negative.  Negative for neck pain.  Skin: Negative.  Negative for rash.  Neurological: Negative.  Negative for dizziness, speech change, focal weakness, weakness and headaches.  Endo/Heme/Allergies:  Does not bruise/bleed easily.  Psychiatric/Behavioral: Negative.  The patient is not nervous/anxious.     As per HPI. Otherwise, a complete review of systems is  negative.  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 [facility]) 08/02/2018    PAST SURGICAL HISTORY: Past Surgical History:  Procedure Laterality Date   APPENDECTOMY     BIOPSY  11/08/2022   Procedure: BIOPSY;  Surgeon: Luke Salaam, MD;  Location: [facility] ENDOSCOPY;  Service: Gastroenterology;;   COLONOSCOPY     COLONOSCOPY WITH PROPOFOL  N/A 11/08/2022   Procedure: COLONOSCOPY WITH PROPOFOL ;  Surgeon: Luke Salaam, MD;  Location: [facility] ENDOSCOPY;  Service: Gastroenterology;  Laterality: N/A;   ESOPHAGOGASTRODUODENOSCOPY (EGD) WITH PROPOFOL  N/A 11/08/2022   Procedure: ESOPHAGOGASTRODUODENOSCOPY (EGD) WITH PROPOFOL ;  Surgeon: Luke Salaam, MD;  Location: [facility] ENDOSCOPY;  Service: Gastroenterology;  Laterality: N/A;   POLYPECTOMY  11/08/2022   Procedure: POLYPECTOMY;  Surgeon: Luke Salaam, MD;  Location: [facility] ENDOSCOPY;  Service: Gastroenterology;;   THROAT SURGERY Right     FAMILY HISTORY: Family History  Problem Relation Age of Onset   Aortic aneurysm Father    Prostate cancer Neg Hx    Bladder Cancer Neg Hx    Kidney cancer Neg Hx     ADVANCED DIRECTIVES (Y/N):  N  HEALTH MAINTENANCE: Social History   Tobacco Use   Smoking status: Never    Passive exposure: Never   Smokeless tobacco: Former    Types: Snuff   Tobacco comments:    [place] herbal snuff   Vaping Use   Vaping status: Never Used  Substance Use Topics   Alcohol use: Yes  Alcohol/week: 0.0 standard drinks of alcohol    Comment: occasional   Drug use: No     Colonoscopy:  PAP:  Bone density:  Lipid panel:  No Known Allergies  Current Outpatient Medications  Medication Sig Dispense Refill   aspirin  EC 81 MG tablet Take 1 tablet (81 mg total) by mouth  daily. 90 tablet 3   clomiPHENE  (CLOMID ) 50 MG tablet Take 0.5 tablets (25 mg total) by mouth daily. 30 tablet 6   hydrocortisone  (ANUSOL -HC) 25 MG suppository Place 1 suppository (25 mg total) rectally every 12 (twelve) hours. 12 suppository 1   Multiple Vitamin (MULTI-VITAMIN) tablet Take 1 tablet by mouth 1 day or 1 dose.     omeprazole  (PRILOSEC) 40 MG capsule Take 1 capsule (40 mg total) by mouth daily. 90 capsule 1   polyethylene glycol-electrolytes (NULYTELY) 420 g solution Starting at 5:00 PM: Drink one 8 oz glass of mixture every 15 minutes until you finish half of the jug. Five hours prior to procedure, drink 8 oz glass of mixture every 15 minutes until it is all gone. Make sure you do not drink anything 4 hours prior to your procedure. 4000 mL 0   SODIUM FLUORIDE 5000 ENAMEL 1.1-5 % GEL      tadalafil  (CIALIS ) 5 MG tablet TAKE 1 TABLET BY MOUTH ONCE DAILY AS NEEDED FOR ERECTILE DYSFUNCTION. 90 tablet 3   famotidine  (PEPCID ) 20 MG tablet Take 1 tablet (20 mg total) by mouth 2 (two) times daily for 10 days. 20 tablet 0   No current facility-administered medications for this visit.    OBJECTIVE: Vitals:   09/26/23 1001  BP: (!) 141/99  Pulse: 63  Resp: 18  Temp: 97.7 F (36.5 C)  SpO2: 100%     Body mass index is 28.58 kg/m.    ECOG FS:0 - Asymptomatic  General: Well-developed, well-nourished, no acute distress. Eyes: Pink conjunctiva, anicteric sclera. HEENT: Normocephalic, moist mucous membranes.  No palpable lymphadenopathy. Lungs: No audible wheezing or coughing. Heart: Regular rate and rhythm. Abdomen: Soft, nontender, no obvious distention. Musculoskeletal: No edema, cyanosis, or clubbing. Neuro: Alert, answering all questions appropriately. Cranial nerves grossly intact. Skin: No rashes or petechiae noted. Psych: Normal affect.  LAB RESULTS:  Lab Results  Component Value Date   NA 136 09/26/2023   K 4.5 09/26/2023   CL 102 09/26/2023   CO2 24 09/26/2023    GLUCOSE 104 (H) 09/26/2023   BUN 20 09/26/2023   CREATININE 0.95 09/26/2023   CALCIUM  8.9 09/26/2023   PROT 7.5 09/26/2023   ALBUMIN 4.4 09/26/2023   AST 35 09/26/2023   ALT 32 09/26/2023   ALKPHOS 47 09/26/2023   BILITOT 0.8 09/26/2023   GFRNONAA >60 09/26/2023   GFRAA >60 01/08/2020    Lab Results  Component Value Date   WBC 4.5 09/26/2023   NEUTROABS 2.9 09/26/2023   HGB 14.4 09/26/2023   HCT 44.5 09/26/2023   MCV 90.1 09/26/2023   PLT 149 (L) 09/26/2023     STUDIES: No results found.  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  Given patient's persistent pancytopenia, weight loss, and dysphasia, cisplatin  was discontinued early and he received his fifth and final infusion on Sep 20, 2018.  PET scan results from January 02, 2019 reviewed independently with no obvious evidence of persistent or recurrent disease.  No further imaging is necessary unless there is concern of recurrence.  No further intervention is needed.  Patient reports a recent normal evaluation by ENT.  Patient is now 5 years removed from completing his treatments and can be discharged from clinic.  Please refer patient back if there are any questions or concerns. Bright red blood per rectum: Resolved.  Colonoscopy and EGD on November 08, 2022 did not reveal any distinct pathology.   Dry mouth/dysphagia: Resolved.   Leukopenia: Resolved.   Thrombocytopenia: Essentially resolved.    Patient expressed understanding and was in agreement with this plan. He also understands that He can call clinic at any time with any questions, concerns, or complaints.    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

## 2023-09-27 ENCOUNTER — Ambulatory Visit
Admission: RE | Admit: 2023-09-27 | Discharge: 2023-09-27 | Disposition: A | Discharge status: Home / Self Care | Source: Ambulatory Visit | Attending: Unknown Physician Specialty | Admitting: Unknown Physician Specialty

## 2023-09-27 ENCOUNTER — Ambulatory Visit
Admission: RE | Admit: 2023-09-27 | Discharge: 2023-09-27 | Discharge status: Home / Self Care | Source: Ambulatory Visit | Attending: Unknown Physician Specialty | Admitting: Unknown Physician Specialty

## 2023-09-27 ENCOUNTER — Other Ambulatory Visit

## 2023-09-27 DIAGNOSIS — R221 Localized swelling, mass and lump, neck: Secondary | ICD-10-CM

## 2023-09-27 DIAGNOSIS — D355 Benign neoplasm of carotid body: Secondary | ICD-10-CM

## 2023-10-09 ENCOUNTER — Ambulatory Visit
Admission: RE | Admit: 2023-10-09 | Discharge: 2023-10-09 | Disposition: A | Discharge status: Home / Self Care | Source: Ambulatory Visit | Attending: Unknown Physician Specialty | Admitting: Unknown Physician Specialty

## 2023-10-09 DIAGNOSIS — R221 Localized swelling, mass and lump, neck: Secondary | ICD-10-CM

## 2023-10-11 ENCOUNTER — Ambulatory Visit: Admitting: Dermatology

## 2023-11-19 ENCOUNTER — Other Ambulatory Visit: Payer: Self-pay

## 2023-11-19 DIAGNOSIS — N529 Male erectile dysfunction, unspecified: Secondary | ICD-10-CM

## 2023-11-19 DIAGNOSIS — R6882 Decreased libido: Secondary | ICD-10-CM

## 2023-11-21 ENCOUNTER — Ambulatory Visit (INDEPENDENT_AMBULATORY_CARE_PROVIDER_SITE_OTHER): Admitting: Urology

## 2023-11-21 ENCOUNTER — Other Ambulatory Visit
Admission: RE | Admit: 2023-11-21 | Discharge: 2023-11-21 | Disposition: A | Discharge status: Home / Self Care | Attending: Urology | Admitting: Urology

## 2023-11-21 VITALS — BP 134/80 | HR 61 | Ht 72.0 in | Wt 207.0 lb

## 2023-11-21 DIAGNOSIS — R6882 Decreased libido: Secondary | ICD-10-CM | POA: Insufficient documentation

## 2023-11-21 DIAGNOSIS — E291 Testicular hypofunction: Secondary | ICD-10-CM | POA: Diagnosis not present

## 2023-11-21 DIAGNOSIS — Z125 Encounter for screening for malignant neoplasm of prostate: Secondary | ICD-10-CM

## 2023-11-21 DIAGNOSIS — N529 Male erectile dysfunction, unspecified: Secondary | ICD-10-CM | POA: Diagnosis not present

## 2023-11-21 LAB — COMPREHENSIVE METABOLIC PANEL WITH GFR
ALT: 27 U/L (ref 0–44)
AST: 29 U/L (ref 15–41)
Albumin: 4.3 g/dL (ref 3.5–5.0)
Alkaline Phosphatase: 52 U/L (ref 38–126)
Anion gap: 9 (ref 5–15)
BUN: 19 mg/dL (ref 8–23)
CO2: 24 mmol/L (ref 22–32)
Calcium: 9 mg/dL (ref 8.9–10.3)
Chloride: 104 mmol/L (ref 98–111)
Creatinine, Ser: 0.95 mg/dL (ref 0.61–1.24)
GFR, Estimated: >60 mL/min (ref >60)
Glucose, Bld: 111 mg/dL — ABNORMAL HIGH (ref 70–99)
Potassium: 3.9 mmol/L (ref 3.5–5.1)
Sodium: 137 mmol/L (ref 135–145)
Total Bilirubin: 0.5 mg/dL (ref 0.0–1.2)
Total Protein: 7.6 g/dL (ref 6.5–8.1)

## 2023-11-21 LAB — PSA: Prostatic Specific Antigen: 0.55 ng/mL (ref 0.00–4.00)

## 2023-11-21 MED ORDER — TADALAFIL 5 MG PO TABS: 5.0000 mg | ORAL_TABLET | Freq: Every day | ORAL | 3 refills | Status: AC | PRN

## 2023-11-21 MED ORDER — CLOMIPHENE CITRATE 50 MG PO TABS: 25.0000 mg | ORAL_TABLET | Freq: Every day | ORAL | 6 refills | Status: AC

## 2023-11-21 NOTE — Addendum Note (Signed)
 Addended by: CLENTON MAEOLA CROME on: 11/21/2023 08:10 AM   Modules accepted: Orders

## 2023-11-21 NOTE — Addendum Note (Signed)
 Addended by: ELOUISE SANTA BROCKS on: 11/21/2023 08:51 AM   Modules accepted: Orders

## 2023-11-21 NOTE — Progress Notes (Signed)
   11/21/2023 8:46 AM   Dustin Hall 1960/11/28 [id number]  Reason for visit: Follow up hypogonadism, fatigue, low libido, ED, PSA screening  HPI: He is a 63 year old male with a history of metastatic squamous cell carcinoma of the head and neck who previously underwent radiation and chemotherapy in 2020.  He has had no evidence of recurrence so far. He continues to follow with oncology and radiation oncology.   He has a history of a borderline low testosterone  of 314, and ultimately opted for trial of Clomid  25 mg daily.  He had an excellent response with increase in testosterone  to the 700 range, with improvement in energy and libido.  He denies any changes over the last year, testosterone  and PSA are pending today.  CMP CBC reviewed from recent labs from oncology and normal.  PSA has been normal, most recently 0.5 last year and is pending today.  Reassurance provided, prostate cancer screening guidelines reviewed.  His wife has been dealing with some health issues over the last year, so he has not been sexually active as often.  Continues on daily Cialis .  We discussed he could stop this until he resumes sexual activity and continue on a daily dose, with max dose of 20 mg/day.   Follow-up testosterone  and PSA today, call with results Clomid  refilled Cialis  refilled Continue yearly follow-up for testosterone , PSA monitoring on Clomid    [name], MD   [facility] [address] [place], [place] [address] [phone]

## 2023-11-22 LAB — TESTOSTERONE: Testosterone: 675 ng/dL (ref 264–916)

## 2023-11-23 ENCOUNTER — Ambulatory Visit: Payer: Self-pay | Admitting: Urology

## 2024-11-20 ENCOUNTER — Ambulatory Visit: Admitting: Urology
